# Patient Record
Sex: Female | Born: 1985 | Race: Black or African American | Hispanic: No | Marital: Married | Smoking: Never smoker
Health system: Southern US, Community
[De-identification: ages and names within clinical notes are randomized; demographics above are authoritative.]

## PROBLEM LIST (undated history)

## (undated) ENCOUNTER — Inpatient Hospital Stay (HOSPITAL_COMMUNITY): Payer: Self-pay

## (undated) DIAGNOSIS — T8859XA Other complications of anesthesia, initial encounter: Secondary | ICD-10-CM

## (undated) DIAGNOSIS — T4145XA Adverse effect of unspecified anesthetic, initial encounter: Secondary | ICD-10-CM

## (undated) DIAGNOSIS — A5901 Trichomonal vulvovaginitis: Secondary | ICD-10-CM

## (undated) DIAGNOSIS — Z8619 Personal history of other infectious and parasitic diseases: Secondary | ICD-10-CM

## (undated) DIAGNOSIS — D509 Iron deficiency anemia, unspecified: Secondary | ICD-10-CM

## (undated) DIAGNOSIS — A609 Anogenital herpesviral infection, unspecified: Secondary | ICD-10-CM

## (undated) DIAGNOSIS — Z973 Presence of spectacles and contact lenses: Secondary | ICD-10-CM

## (undated) DIAGNOSIS — D573 Sickle-cell trait: Secondary | ICD-10-CM

## (undated) DIAGNOSIS — A6000 Herpesviral infection of urogenital system, unspecified: Secondary | ICD-10-CM

## (undated) DIAGNOSIS — R51 Headache: Secondary | ICD-10-CM

## (undated) DIAGNOSIS — R609 Edema, unspecified: Principal | ICD-10-CM

## (undated) DIAGNOSIS — N760 Acute vaginitis: Secondary | ICD-10-CM

## (undated) DIAGNOSIS — B9689 Other specified bacterial agents as the cause of diseases classified elsewhere: Secondary | ICD-10-CM

## (undated) HISTORY — PX: WISDOM TOOTH EXTRACTION: SHX21

---
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
## 2009-08-06 ENCOUNTER — Emergency Department (HOSPITAL_COMMUNITY): Admission: EM | Admit: 2009-08-06 | Discharge: 2009-08-06 | Payer: Self-pay | Admitting: Emergency Medicine

## 2010-01-24 ENCOUNTER — Inpatient Hospital Stay (HOSPITAL_COMMUNITY): Admission: AD | Admit: 2010-01-24 | Discharge: 2010-01-24 | Payer: Self-pay | Admitting: Obstetrics & Gynecology

## 2010-01-31 ENCOUNTER — Inpatient Hospital Stay (HOSPITAL_COMMUNITY): Admission: RE | Admit: 2010-01-31 | Discharge: 2010-02-03 | Payer: Self-pay | Admitting: Obstetrics and Gynecology

## 2010-09-26 ENCOUNTER — Emergency Department (HOSPITAL_COMMUNITY)
Admission: EM | Admit: 2010-09-26 | Discharge: 2010-09-26 | Payer: Self-pay | Source: Home / Self Care | Admitting: Emergency Medicine

## 2010-09-28 ENCOUNTER — Inpatient Hospital Stay (HOSPITAL_COMMUNITY)
Admission: AD | Admit: 2010-09-28 | Discharge: 2010-09-28 | Payer: Self-pay | Source: Home / Self Care | Attending: Obstetrics and Gynecology | Admitting: Obstetrics and Gynecology

## 2010-10-01 ENCOUNTER — Inpatient Hospital Stay (HOSPITAL_COMMUNITY)
Admission: AD | Admit: 2010-10-01 | Discharge: 2010-10-01 | Payer: Self-pay | Source: Home / Self Care | Attending: Obstetrics and Gynecology | Admitting: Obstetrics and Gynecology

## 2010-12-26 LAB — POCT URINALYSIS DIPSTICK
Bilirubin Urine: NEGATIVE
Glucose, UA: NEGATIVE mg/dL
Hgb urine dipstick: NEGATIVE
Ketones, ur: NEGATIVE mg/dL
Nitrite: NEGATIVE
Protein, ur: NEGATIVE mg/dL
Specific Gravity, Urine: >=1.03 (ref 1.005–1.030)
Urobilinogen, UA: 1 mg/dL (ref 0.0–1.0)
pH: 6 (ref 5.0–8.0)

## 2010-12-26 LAB — POCT PREGNANCY, URINE: Preg Test, Ur: NEGATIVE

## 2011-01-03 LAB — NO BLOOD PRODUCTS

## 2011-01-03 LAB — CBC
HCT: 33 % — ABNORMAL LOW (ref 36.0–46.0)
Hemoglobin: 11.2 g/dL — ABNORMAL LOW (ref 12.0–15.0)
MCHC: 33.8 g/dL (ref 30.0–36.0)
MCV: 86.6 fL (ref 78.0–100.0)
Platelets: 122 10*3/uL — ABNORMAL LOW (ref 150–400)
Platelets: 140 10*3/uL — ABNORMAL LOW (ref 150–400)
RBC: 3.82 MIL/uL — ABNORMAL LOW (ref 3.87–5.11)
RDW: 14.4 % (ref 11.5–15.5)
WBC: 13.8 10*3/uL — ABNORMAL HIGH (ref 4.0–10.5)
WBC: 9.5 10*3/uL (ref 4.0–10.5)

## 2011-01-03 LAB — RPR: RPR Ser Ql: NONREACTIVE

## 2011-01-19 LAB — BASIC METABOLIC PANEL
BUN: 6 mg/dL (ref 6–23)
Calcium: 9.1 mg/dL (ref 8.4–10.5)
GFR calc non Af Amer: >60 mL/min (ref >60)
Potassium: 3.5 mEq/L (ref 3.5–5.1)
Sodium: 135 mEq/L (ref 135–145)

## 2011-01-19 LAB — URINALYSIS, ROUTINE W REFLEX MICROSCOPIC
Nitrite: NEGATIVE
Specific Gravity, Urine: 1.035 — ABNORMAL HIGH (ref 1.005–1.030)
pH: 6.5 (ref 5.0–8.0)

## 2011-01-19 LAB — DIFFERENTIAL
Eosinophils Relative: 0 % (ref 0–5)
Lymphocytes Relative: 20 % (ref 12–46)
Lymphs Abs: 1.5 10*3/uL (ref 0.7–4.0)
Neutro Abs: 5.4 10*3/uL (ref 1.7–7.7)

## 2011-01-19 LAB — CBC
HCT: 34.4 % — ABNORMAL LOW (ref 36.0–46.0)
Platelets: 169 10*3/uL (ref 150–400)
WBC: 7.5 10*3/uL (ref 4.0–10.5)

## 2011-01-19 LAB — HCG, QUANTITATIVE, PREGNANCY: hCG, Beta Chain, Quant, S: 42001 m[IU]/mL — ABNORMAL HIGH (ref <5)

## 2011-04-14 ENCOUNTER — Inpatient Hospital Stay (INDEPENDENT_AMBULATORY_CARE_PROVIDER_SITE_OTHER)
Admission: RE | Admit: 2011-04-14 | Discharge: 2011-04-14 | Disposition: A | Discharge status: Home / Self Care | Payer: Self-pay | Source: Ambulatory Visit | Attending: Family Medicine | Admitting: Family Medicine

## 2011-04-14 DIAGNOSIS — K921 Melena: Secondary | ICD-10-CM

## 2011-04-14 LAB — OCCULT BLOOD, POC DEVICE: Fecal Occult Bld: POSITIVE

## 2011-12-12 ENCOUNTER — Emergency Department (INDEPENDENT_AMBULATORY_CARE_PROVIDER_SITE_OTHER)
Admission: EM | Admit: 2011-12-12 | Discharge: 2011-12-12 | Disposition: A | Discharge status: Home / Self Care | Payer: Self-pay | Source: Home / Self Care | Attending: Emergency Medicine | Admitting: Emergency Medicine

## 2011-12-12 ENCOUNTER — Encounter (HOSPITAL_COMMUNITY): Payer: Self-pay | Admitting: Emergency Medicine

## 2011-12-12 DIAGNOSIS — G43909 Migraine, unspecified, not intractable, without status migrainosus: Secondary | ICD-10-CM

## 2011-12-12 DIAGNOSIS — R319 Hematuria, unspecified: Secondary | ICD-10-CM

## 2011-12-12 DIAGNOSIS — K529 Noninfective gastroenteritis and colitis, unspecified: Secondary | ICD-10-CM

## 2011-12-12 DIAGNOSIS — G43109 Migraine with aura, not intractable, without status migrainosus: Secondary | ICD-10-CM

## 2011-12-12 DIAGNOSIS — K5289 Other specified noninfective gastroenteritis and colitis: Secondary | ICD-10-CM

## 2011-12-12 DIAGNOSIS — H9209 Otalgia, unspecified ear: Secondary | ICD-10-CM

## 2011-12-12 DIAGNOSIS — M549 Dorsalgia, unspecified: Secondary | ICD-10-CM

## 2011-12-12 LAB — POCT PREGNANCY, URINE: Preg Test, Ur: NEGATIVE

## 2011-12-12 LAB — POCT URINALYSIS DIP (DEVICE)
Bilirubin Urine: NEGATIVE
Glucose, UA: NEGATIVE mg/dL
Ketones, ur: NEGATIVE mg/dL
Nitrite: NEGATIVE
pH: 6 (ref 5.0–8.0)

## 2011-12-12 MED ORDER — TRAMADOL HCL 50 MG PO TABS: 100.0000 mg | ORAL_TABLET | Freq: Three times a day (TID) | ORAL | Status: AC | PRN

## 2011-12-12 MED ORDER — METHOCARBAMOL 500 MG PO TABS: 500.0000 mg | ORAL_TABLET | Freq: Three times a day (TID) | ORAL | Status: AC

## 2011-12-12 MED ORDER — MELOXICAM 7.5 MG PO TABS: 7.5000 mg | ORAL_TABLET | Freq: Every day | ORAL | Status: DC

## 2011-12-12 MED ORDER — ONDANSETRON 8 MG PO TBDP: 8.0000 mg | ORAL_TABLET | Freq: Three times a day (TID) | ORAL | Status: AC | PRN

## 2011-12-12 MED ORDER — CEPHALEXIN 500 MG PO CAPS: 500.0000 mg | ORAL_CAPSULE | Freq: Three times a day (TID) | ORAL | Status: AC

## 2011-12-12 NOTE — ED Provider Notes (Signed)
History     CSN: [id number]  Arrival date & time 12/12/11  0865   First MD Initiated Contact with Patient 12/12/11 1003      Chief Complaint  Patient presents with  . Otalgia    (Consider location/radiation/quality/duration/timing/severity/associated sxs/prior treatment) HPI Comments: The patient is in today for multiple problems including headache, back pain, abdominal pain, and earache. Most of these have been going on for a couple of days.  The back pain is her biggest problem. It's localized to the mid back area and may be on the left side, right side, or both sides. Is rated as a 7/10 in intensity and feels like an ache. There is no radiation. She denies any injury, states she just woke up with the pain. There is nothing that makes the pain worse. It's better with heat. She didn't have it before. She denies any urinary symptoms, numbness, tingling, or weakness in the lower extremities.  Also since yesterday she's had some headache. This is bifrontal, sharp, and associated with nasal congestion. She notes some nausea and some photophobia. His better with pain meds. She denies any visual or neurological symptoms. She does have a history of migraine type headaches in the past.  Also since yesterday she's had an epigastric pain. She states she feels like she's been kicked in the abdomen. The pain is got a little bit better and she's felt nauseated and had 4 diarrheal stools yesterday which were yellow in color. She denies any nausea or vomiting.  Also since yesterday she's had pain in both of her ears. She denies any drainage. She's felt chilled and hot.  Patient is a 26 y.o. female presenting with ear pain.  Otalgia Associated symptoms include headaches, abdominal pain and diarrhea. Pertinent negatives include no rhinorrhea, no sore throat, no vomiting, no cough and no rash.    History reviewed. No pertinent past medical history.  Past Surgical History  Procedure Date  . Cesarean  section     Family History  Problem Relation Age of Onset  . Hypertension Other     History  Substance Use Topics  . Smoking status: Current Everyday Smoker  . Smokeless tobacco: Not on file  . Alcohol Use: Yes    OB History    Grav Para Term Preterm Abortions TAB SAB Ect Mult Living                  Review of Systems  Constitutional: Negative for fever, chills, fatigue and unexpected weight change.  HENT: Positive for ear pain. Negative for congestion, sore throat and rhinorrhea.   Eyes: Negative for redness and visual disturbance.  Respiratory: Negative for cough, shortness of breath and wheezing.   Cardiovascular: Negative for chest pain and palpitations.  Gastrointestinal: Positive for abdominal pain and diarrhea. Negative for nausea, vomiting and constipation.  Genitourinary: Negative for dysuria, urgency and frequency.  Musculoskeletal: Positive for back pain.  Skin: Negative for rash.  Neurological: Positive for headaches. Negative for dizziness, tremors, seizures, syncope, facial asymmetry, speech difficulty, weakness, light-headedness and numbness.    Allergies  Review of patient's allergies indicates no known allergies.  Home Medications   Current Outpatient Rx  Name Route Sig Dispense Refill  . ACETAMINOPHEN 325 MG PO TABS Oral Take 650 mg by mouth every 6 (six) hours as needed.    . CEPHALEXIN 500 MG PO CAPS Oral Take 1 capsule (500 mg total) by mouth 3 (three) times daily. 30 capsule 0  . MELOXICAM 7.5 MG PO  TABS Oral Take 1 tablet (7.5 mg total) by mouth daily. 15 tablet 0  . METHOCARBAMOL 500 MG PO TABS Oral Take 1 tablet (500 mg total) by mouth 3 (three) times daily. 30 tablet 0  . ONDANSETRON 8 MG PO TBDP Oral Take 1 tablet (8 mg total) by mouth every 8 (eight) hours as needed for nausea. 20 tablet 0  . TRAMADOL HCL 50 MG PO TABS Oral Take 2 tablets (100 mg total) by mouth every 8 (eight) hours as needed for pain. 30 tablet 0    BP 129/89  Pulse 83   Temp(Src) 98.6 F (37 C) (Oral)  SpO2 99%  LMP 12/12/2011  Physical Exam  Nursing note and vitals reviewed. Constitutional: She is oriented to person, place, and time. She appears well-developed and well-nourished. No distress.  HENT:  Head: Normocephalic and atraumatic.  Mouth/Throat: Oropharynx is clear and moist.  Eyes: Conjunctivae and EOM are normal. Pupils are equal, round, and reactive to light. No scleral icterus.  Neck: Normal range of motion. Neck supple.  Cardiovascular: Normal rate, regular rhythm and normal heart sounds.  Exam reveals no gallop and no friction rub.   No murmur heard. Pulmonary/Chest: Effort normal and breath sounds normal. No respiratory distress. She has no wheezes. She has no rales.  Abdominal: Soft. Bowel sounds are normal. She exhibits no distension and no mass. There is no tenderness. There is no rebound and no guarding.  Lymphadenopathy:    She has no cervical adenopathy.  Neurological: She is alert and oriented to person, place, and time.  Skin: Skin is warm and dry. No rash noted. She is not diaphoretic.    ED Course  Procedures (including critical care time)  Labs Reviewed  POCT URINALYSIS DIP (DEVICE) - Abnormal; Notable for the following:    Hgb urine dipstick LARGE (*)    Protein, ur 30 (*)    Leukocytes, UA SMALL (*) Biochemical Testing Only. Please order routine urinalysis from main lab if confirmatory testing is needed.   All other components within normal limits  POCT PREGNANCY, URINE  URINE CULTURE   No results found.   1. Back pain   2. Migraine headache   3. Gastroenteritis   4. Earache   5. Hematuria       MDM          Roque Lias, MD 12/12/11 [phone]

## 2011-12-12 NOTE — Discharge Instructions (Signed)
B.R.A.T. Diet Your doctor has recommended the B.R.A.T. diet for you or your child until the condition improves. This is often used to help control diarrhea and vomiting symptoms. If you or your child can tolerate clear liquids, you may have:  Bananas.   Rice.   Applesauce.   Toast (and other simple starches such as crackers, potatoes, noodles).  Be sure to avoid dairy products, meats, and fatty foods until symptoms are better. Fruit juices such as apple, grape, and prune juice can make diarrhea worse. Avoid these. Continue this diet for 2 days or as instructed by your caregiver. Document Released: 10/02/2005 Document Revised: 06/14/2011 Document Reviewed: 03/21/2007 [facility] Patient Information 2012 [place], [place].Back Exercises Back exercises help treat and prevent back injuries. The goal is to increase your strength in your belly (abdominal) and back muscles. These exercises can also help with flexibility. Start these exercises when told by your doctor. HOME CARE Back exercises include: Pelvic Tilt.  Lie on your back with your knees bent. Tilt your pelvis until the lower part of your back is against the floor. Hold this position 5 to 10 sec. Repeat this exercise 5 to 10 times.  Knee to Chest.  Pull 1 knee up against your chest and hold for 20 to 30 seconds. Repeat this with the other knee. This may be done with the other leg straight or bent, whichever feels better. Then, pull both knees up against your chest.  Sit-Ups or Curl-Ups.  Bend your knees 90 degrees. Start with tilting your pelvis, and do a partial, slow sit-up. Only lift your upper half 30 to 45 degrees off the floor. Take at least 2 to 3 seonds for each sit-up. Do not do sit-ups with your knees out straight. If partial sit-ups are difficult, simply do the above but with only tightening your belly (abdominal) muscles and holding it as told.  Hip-Lift.  Lie on your back with your knees flexed 90 degrees. Push down with your  feet and shoulders as you raise your hips 2 inches off the floor. Hold for 10 seconds, repeat 5 to 10 times.  Back Arches.  Lie on your stomach. Prop yourself up on bent elbows. Slowly press on your hands, causing an arch in your low back. Repeat 3 to 5 times.  Shoulder-Lifts.  Lie face down with arms beside your body. Keep hips and belly pressed to floor as you slowly lift your head and shoulders off the floor.  Do not overdo your exercises. Be careful in the beginning. Exercises may cause you some mild back discomfort. If the pain lasts for more than 15 minutes, stop the exercises until you see your doctor. Improvement with exercise for back problems is slow.  Document Released: 11/04/2010 Document Revised: 06/14/2011 Document Reviewed: 11/04/2010 [facility] Patient Information 2012 [place], [place].

## 2011-12-12 NOTE — ED Notes (Signed)
C/o head, back, stomach and ears aching.  Onset yesterday of symptoms.  Also had" severe" diarrhea yesterday per patient, reports for episodes. Also reports no episodes of diarrhea today

## 2011-12-13 LAB — URINE CULTURE: Culture  Setup Time: 201302261734

## 2011-12-27 ENCOUNTER — Emergency Department (INDEPENDENT_AMBULATORY_CARE_PROVIDER_SITE_OTHER)
Admission: EM | Admit: 2011-12-27 | Discharge: 2011-12-27 | Disposition: A | Discharge status: Home / Self Care | Payer: Self-pay | Source: Home / Self Care | Attending: Emergency Medicine | Admitting: Emergency Medicine

## 2011-12-27 ENCOUNTER — Encounter (HOSPITAL_COMMUNITY): Payer: Self-pay | Admitting: *Deleted

## 2011-12-27 DIAGNOSIS — K0500 Acute gingivitis, plaque induced: Secondary | ICD-10-CM

## 2011-12-27 DIAGNOSIS — Z8719 Personal history of other diseases of the digestive system: Secondary | ICD-10-CM

## 2011-12-27 MED ORDER — PENICILLIN V POTASSIUM 500 MG PO TABS: 500.0000 mg | ORAL_TABLET | Freq: Three times a day (TID) | ORAL | Status: AC

## 2011-12-27 MED ORDER — ACETAMINOPHEN-CODEINE #3 300-30 MG PO TABS: 1.0000 | ORAL_TABLET | Freq: Four times a day (QID) | ORAL | Status: AC | PRN

## 2011-12-27 NOTE — ED Provider Notes (Signed)
History     CSN: [id number]  Arrival date & time 12/27/11  1038   First MD Initiated Contact with Patient 12/27/11 1144      Chief Complaint  Patient presents with  . Jaw Pain    (Consider location/radiation/quality/duration/timing/severity/associated sxs/prior treatment) HPI Comments: Patient presents urgent care today complaining of the left lower jaw pain in swelling been sore inside of her mouth for about 2 days has gotten progressively worse the tried use an over-the-counter cream applying it directly to the area sore on her lower left molars with partial relief. This morning and noticed her left  JAW was mildly swollen and having a throbbing pain  The history is provided by the patient.    History reviewed. No pertinent past medical history.  Past Surgical History  Procedure Date  . Cesarean section     Family History  Problem Relation Age of Onset  . Hypertension Other     History  Substance Use Topics  . Smoking status: Current Everyday Smoker  . Smokeless tobacco: Not on file  . Alcohol Use: Yes    OB History    Grav Para Term Preterm Abortions TAB SAB Ect Mult Living                  Review of Systems  Constitutional: Negative for fever, activity change, appetite change and fatigue.  HENT: Negative for neck stiffness.   Skin: Negative for rash.    Allergies  Review of patient's allergies indicates no known allergies.  Home Medications   Current Outpatient Rx  Name Route Sig Dispense Refill  . ACETAMINOPHEN 325 MG PO TABS Oral Take 650 mg by mouth every 6 (six) hours as needed.    . ACETAMINOPHEN-CODEINE #3 300-30 MG PO TABS Oral Take 1-2 tablets by mouth every 6 (six) hours as needed for pain. 15 tablet 0  . MELOXICAM 7.5 MG PO TABS Oral Take 1 tablet (7.5 mg total) by mouth daily. 15 tablet 0  . PENICILLIN V POTASSIUM 500 MG PO TABS Oral Take 1 tablet (500 mg total) by mouth 3 (three) times daily. 30 tablet 0    BP 115/78  Pulse 90   Temp(Src) 98.4 F (36.9 C) (Oral)  Resp 20  SpO2 99%  LMP 12/12/2011  Physical Exam  Nursing note and vitals reviewed. Constitutional: She appears well-developed and well-nourished. No distress.  HENT:  Mouth/Throat: Uvula is midline. Oral lesions present. No dental abscesses, uvula swelling or dental caries. No oropharyngeal exudate.      ED Course  Procedures (including critical care time)  Labs Reviewed - No data to display No results found.   1. Gingivitis, acute   2. History of dental abscess       MDM  Patient presents urgent care complaining of left lower jaw pain in oral swelling for less than 24 hours. Afebrile with marked gingival erythema surrounding last lower left molar and minimally perceivable soft tissue swelling on the mandibular region. Afebrile.        Jimmie Molly, MD 12/27/11 [phone]

## 2011-12-27 NOTE — ED Notes (Signed)
Pt is here with complaints of lower left jaw pain and oral swelling with onset yesterday.

## 2011-12-27 NOTE — Discharge Instructions (Signed)
Dental Care and Dentist Visits   Dental care supports good overall health. Regular dental visits can also help you avoid dental pain, bleeding, infection, and other more serious health problems in the future. It is important to keep the mouth healthy because diseases in the teeth, gums, and other oral tissues can spread to other areas of the body. Some problems, such as diabetes, heart disease, and pre-term labor have been associated with poor oral health.   See your dentist every 6 months. If you experience emergency problems such as a toothache or broken tooth, go to the dentist right away. If you see your dentist regularly, you may catch problems early. It is easier to be treated for problems in the early stages.   WHAT TO EXPECT AT A DENTIST VISIT   Your dentist will look for many common oral health problems and recommend proper treatment. At your regular dental visit, you can expect:   Gentle cleaning of the teeth and gums. This includes scraping and polishing. This helps to remove the sticky substance around the teeth and gums (plaque). Plaque forms in the mouth shortly after eating. Over time, plaque hardens on the teeth as tartar. If tartar is not removed regularly, it can cause problems. Cleaning also helps remove stains.   Periodic X-rays. These pictures of the teeth and supporting bone will help your dentist assess the health of your teeth.   Periodic fluoride treatments. Fluoride is a natural mineral shown to help strengthen teeth. Fluoride treatment involves applying a fluoride gel or varnish to the teeth. It is most commonly done in children.   Examination of the mouth, tongue, jaws, teeth, and gums to look for any oral health problems, such as:   Cavities (dental caries). This is decay on the tooth caused by plaque, sugar, and acid in the mouth. It is best to catch a cavity when it is small.   Inflammation of the gums caused by plaque buildup (gingivitis).   Problems with the mouth or malformed or  misaligned teeth.   Oral cancer or other diseases of the soft tissues or jaws.   KEEP YOUR TEETH AND GUMS HEALTHY   For healthy teeth and gums, follow these general guidelines as well as your dentist's specific advice:   Have your teeth professionally cleaned at the dentist every 6 months.   Brush twice daily with a fluoride toothpaste.   Floss your teeth daily.   Ask your dentist if you need fluoride supplements, treatments, or fluoride toothpaste.   Eat a healthy diet. Reduce foods and drinks with added sugar.   Avoid smoking.  TREATMENT FOR ORAL HEALTH PROBLEMS   If you have oral health problems, treatment varies depending on the conditions present in your teeth and gums.   Your caregiver will most likely recommend good oral hygiene at each visit.   For cavities, gingivitis, or other oral health disease, your caregiver will perform a procedure to treat the problem. This is typically done at a separate appointment. Sometimes your caregiver will refer you to another dental specialist for specific tooth problems or for surgery.  SEEK IMMEDIATE DENTAL CARE IF:   You have pain, bleeding, or soreness in the gum, tooth, jaw, or mouth area.   A permanent tooth becomes loose or separated from the gum socket.   You experience a blow or injury to the mouth or jaw area.  Document Released: 06/14/2011 Document Revised: 09/21/2011 Document Reviewed: 06/14/2011   [employer] Patient Information 2012 [employer].

## 2012-01-31 ENCOUNTER — Emergency Department (INDEPENDENT_AMBULATORY_CARE_PROVIDER_SITE_OTHER)
Admission: EM | Admit: 2012-01-31 | Discharge: 2012-01-31 | Disposition: A | Discharge status: Home / Self Care | Payer: Self-pay | Source: Home / Self Care | Attending: Emergency Medicine | Admitting: Emergency Medicine

## 2012-01-31 ENCOUNTER — Encounter (HOSPITAL_COMMUNITY): Payer: Self-pay

## 2012-01-31 DIAGNOSIS — R609 Edema, unspecified: Secondary | ICD-10-CM

## 2012-01-31 HISTORY — DX: Edema, unspecified: R60.9

## 2012-01-31 LAB — POCT I-STAT, CHEM 8
BUN: 7 mg/dL (ref 6–23)
Calcium, Ion: 1.22 mmol/L (ref 1.12–1.32)
Chloride: 106 mEq/L (ref 96–112)
Creatinine, Ser: 0.9 mg/dL (ref 0.50–1.10)
TCO2: 25 mmol/L (ref 0–100)

## 2012-01-31 LAB — POCT URINALYSIS DIP (DEVICE)
Hgb urine dipstick: NEGATIVE
Protein, ur: NEGATIVE mg/dL
Specific Gravity, Urine: 1.025 (ref 1.005–1.030)
Urobilinogen, UA: 0.2 mg/dL (ref 0.0–1.0)
pH: 6.5 (ref 5.0–8.0)

## 2012-01-31 NOTE — ED Notes (Signed)
patient has pain and swelling in right foot; no known injury to account for pain and swelling; pitting edema to ankle, foot w/d/color good, difficult to palpate pedal pulse, but is present

## 2012-01-31 NOTE — ED Provider Notes (Signed)
History     CSN: [id number]  Arrival date & time 01/31/12  1228   First MD Initiated Contact with Patient 01/31/12 1415      Chief Complaint  Patient presents with  . Foot Pain    (Consider location/radiation/quality/duration/timing/severity/associated sxs/prior treatment) HPI Comments: Patient with intermittent right foot swelling for 2 weeks. There no aggravating or alleviating factors. States the foot swelling is not worse at the end of the day, and is not relieved with elevation. Took an unknown "water pill" x one without any improvement in her symptoms. Denies any remote or recent injury to her foot, increase in physical activity, new shoes. No color change in her foot, paresthesias, lower extremity edema, swelling of her other foot, unintentional weight gain, facial swelling. No urinary complaints. No history of DVT or PE. No history of diabetes, renal disease. Patient states that her feet, right more than left, would swell but this happened near the end of her pregnancy.   ROS as noted in HPI. All other ROS negative.   Patient is a 26 y.o. female presenting with lower extremity pain. The history is provided by the patient.  Foot Pain    Past Medical History  Diagnosis Date  . Peripheral edema     Past Surgical History  Procedure Date  . Cesarean section     Family History  Problem Relation Age of Onset  . Hypertension Other     History  Substance Use Topics  . Smoking status: Current Everyday Smoker  . Smokeless tobacco: Not on file  . Alcohol Use: Yes    OB History    Grav Para Term Preterm Abortions TAB SAB Ect Mult Living                  Review of Systems  Allergies  Review of patient's allergies indicates no known allergies.  Home Medications   Current Outpatient Rx  Name Route Sig Dispense Refill  . ACETAMINOPHEN 325 MG PO TABS Oral Take 650 mg by mouth every 6 (six) hours as needed.    . MELOXICAM 7.5 MG PO TABS Oral Take 1 tablet (7.5 mg  total) by mouth daily. 15 tablet 0    BP 112/65  Pulse 70  Temp(Src) 97 F (36.1 C) (Oral)  Resp 16  SpO2 100%  LMP 12/16/2011  Physical Exam  Nursing note and vitals reviewed. Constitutional: She is oriented to person, place, and time. She appears well-developed and well-nourished. No distress.  HENT:  Head: Normocephalic and atraumatic.  Eyes: Conjunctivae and EOM are normal.  Neck: Normal range of motion.  Cardiovascular: Normal rate, regular rhythm, normal heart sounds and intact distal pulses.   Pulmonary/Chest: Effort normal.  Abdominal: She exhibits no distension.  Musculoskeletal: Normal range of motion.       1+ pitting edema right foot to ankle. No ankle, foot bony tenderness. Skin warm and intact. Refill less than 2 seconds. No signs of trauma, deformity. Sensation grossly intact. No edema left foot, or on bilateral calves. Calves nontender, symmetric.  Neurological: She is alert and oriented to person, place, and time.  Skin: Skin is warm and dry.  Psychiatric: She has a normal mood and affect. Her behavior is normal. Judgment and thought content normal.    ED Course  Procedures (including critical care time)  Labs Reviewed  POCT URINALYSIS DIP (DEVICE) - Abnormal; Notable for the following:    Ketones, ur TRACE (*)    All other components within normal limits  POCT PREGNANCY, URINE  POCT I-STAT, CHEM 8   No results found.   1. Peripheral edema     Results for orders placed during the hospital encounter of 01/31/12  POCT URINALYSIS DIP (DEVICE)      Component Value Range   Glucose, UA NEGATIVE  NEGATIVE (mg/dL)   Bilirubin Urine NEGATIVE  NEGATIVE    Ketones, ur TRACE (*) NEGATIVE (mg/dL)   Specific Gravity, Urine 1.025  1.005 - 1.030    Hgb urine dipstick NEGATIVE  NEGATIVE    pH 6.5  5.0 - 8.0    Protein, ur NEGATIVE  NEGATIVE (mg/dL)   Urobilinogen, UA 0.2  0.0 - 1.0 (mg/dL)   Nitrite NEGATIVE  NEGATIVE    Leukocytes, UA NEGATIVE  NEGATIVE     POCT PREGNANCY, URINE      Component Value Range   Preg Test, Ur NEGATIVE  NEGATIVE   POCT I-STAT, CHEM 8      Component Value Range   Sodium 139  135 - 145 (mEq/L)   Potassium 3.9  3.5 - 5.1 (mEq/L)   Chloride 106  96 - 112 (mEq/L)   BUN 7  6 - 23 (mg/dL)   Creatinine, Ser 1.47  0.50 - 1.10 (mg/dL)   Glucose, Bld 84  70 - 99 (mg/dL)   Calcium, Ion 8.29  5.62 - 1.32 (mmol/L)   TCO2 25  0 - 100 (mmol/L)   Hemoglobin 13.3  12.0 - 15.0 (g/dL)   HCT 13.0  86.5 - 78.4 (%)    MDM  Previous records reviewed. Additional medical history obtained.  Patient has no tenderness along her foot or ankle. No history of trauma. Doubt occult fracture.  past symmetric. Doubt DVT.Patient states that her feet as well as she's pregnant, and is late for her menses. Checking defect. Also checking UA to rule out proteinuria. Patient denies any facial swelling, unintentional weight gain. Also checking i-STAT to evaluate kidney function. No evidence of infection.  Labs reviewed. No hematuria, proteinuria. Normal. Discussed results with patient. Will have her try TED hose in addition to decreasing salt in her diet. Emphasized importance of primary care followup. Will refer her to local resources. Patient agrees with plan.  Luiz Blare, MD 01/31/12 [phone]

## 2012-01-31 NOTE — Discharge Instructions (Signed)
Your urine and bloodwork were normal today. Try to reduce the salt in your diet, cut out process foods such as telling me, pre-packaged dinners. Keep your foot elevated as she possibly can. Try some shoes with good support to help prevent swelling. Wear the compression hose at all times until the swelling starts to improve. You'll need to find primary care physician for routine physical care. Below is a list of resources.  Go to www.goodrx.com to look up your medications. This will give you a list of where you can find your prescriptions at the most affordable prices.   RESOURCE GUIDE  Chronic Pain Problems Contact [facility]  [phone] Patients need to be referred by their primary care doctor.  Insufficient Money for Medicine Contact [employer]:  call "211" or [facility] [phone].  No Primary Care Doctor Call [facility]  [phone] Other agencies that provide inexpensive medical care    [facility]  [phone]    [facility]  [phone]    [facility]  [phone]    [facility]  [phone] [address] [place] [address]    [facility]  [phone]    [facility]  [phone] [facility] [phone]   [address] [place] [address] [place], [place] [address]  [facility] Resources  [facility]     [employer]                          [facility] [address] [place]                       [address]      [address] [place] [address]   [phone] (After Hours)

## 2012-12-11 ENCOUNTER — Emergency Department (HOSPITAL_COMMUNITY)
Admission: EM | Admit: 2012-12-11 | Discharge: 2012-12-11 | Disposition: A | Discharge status: Home / Self Care | Payer: Self-pay | Attending: Emergency Medicine | Admitting: Emergency Medicine

## 2012-12-11 ENCOUNTER — Encounter (HOSPITAL_COMMUNITY): Payer: Self-pay | Admitting: *Deleted

## 2012-12-11 DIAGNOSIS — F17200 Nicotine dependence, unspecified, uncomplicated: Secondary | ICD-10-CM | POA: Insufficient documentation

## 2012-12-11 DIAGNOSIS — Z23 Encounter for immunization: Secondary | ICD-10-CM | POA: Insufficient documentation

## 2012-12-11 DIAGNOSIS — Z8742 Personal history of other diseases of the female genital tract: Secondary | ICD-10-CM | POA: Insufficient documentation

## 2012-12-11 DIAGNOSIS — L02619 Cutaneous abscess of unspecified foot: Secondary | ICD-10-CM | POA: Insufficient documentation

## 2012-12-11 LAB — COMPREHENSIVE METABOLIC PANEL
AST: 20 U/L (ref 0–37)
Albumin: 3.7 g/dL (ref 3.5–5.2)
Chloride: 102 mEq/L (ref 96–112)
Creatinine, Ser: 0.77 mg/dL (ref 0.50–1.10)
Potassium: 3.7 mEq/L (ref 3.5–5.1)
Total Bilirubin: 0.4 mg/dL (ref 0.3–1.2)
Total Protein: 7.7 g/dL (ref 6.0–8.3)

## 2012-12-11 LAB — CBC WITH DIFFERENTIAL/PLATELET
Basophils Absolute: 0 10*3/uL (ref 0.0–0.1)
Basophils Relative: 0 % (ref 0–1)
MCHC: 35.8 g/dL (ref 30.0–36.0)
Monocytes Absolute: 0.4 10*3/uL (ref 0.1–1.0)
Neutro Abs: 2.9 10*3/uL (ref 1.7–7.7)
Neutrophils Relative %: 45 % (ref 43–77)
RDW: 13.6 % (ref 11.5–15.5)

## 2012-12-11 MED ORDER — CLINDAMYCIN HCL 150 MG PO CAPS: 150.0000 mg | ORAL_CAPSULE | Freq: Four times a day (QID) | ORAL | Status: DC

## 2012-12-11 MED ORDER — TETANUS-DIPHTH-ACELL PERTUSSIS 5-2.5-18.5 LF-MCG/0.5 IM SUSP
0.5000 mL | Freq: Once | INTRAMUSCULAR | Status: AC
  Administered 2012-12-11: 0.5 mL via INTRAMUSCULAR
  Filled 2012-12-11: qty 0.5

## 2012-12-11 NOTE — ED Notes (Signed)
Redness and swelling to dorsal aspect of rgt foot; warm to touch; no known injury; good PMS to rgt foot

## 2012-12-11 NOTE — ED Notes (Signed)
Pt noticed swollen 5th digit to her R foot  2 days ago.  Today she noticed a crack between the 4th and 5th digit, redness to top of foot and a warm sensation going up her leg.  C/o itching/burning/ stinging pain to top of foot.

## 2012-12-11 NOTE — ED Provider Notes (Signed)
History     CSN: [id number]  Arrival date & time 12/11/12  1814   First MD Initiated Contact with Patient 12/11/12 2306      Chief Complaint  Patient presents with  . Cellulitis    (Consider location/radiation/quality/duration/timing/severity/associated sxs/prior treatment) The history is provided by the patient.  pt here with right foot swelling and redness to the top of the foot x 24 hrs--some itching, no fever--sx have been constant--worse with walking, no meds taken pta, no drainage--denies trauma, no prior h/o same  Past Medical History  Diagnosis Date  . Peripheral edema     Past Surgical History  Procedure Laterality Date  . Cesarean section      Family History  Problem Relation Age of Onset  . Hypertension Other     History  Substance Use Topics  . Smoking status: Current Some Day Smoker  . Smokeless tobacco: Not on file  . Alcohol Use: 4.2 oz/week    7 Glasses of wine per week    OB History   Grav Para Term Preterm Abortions TAB SAB Ect Mult Living                  Review of Systems  All other systems reviewed and are negative.    Allergies  Review of patient's allergies indicates no known allergies.  Home Medications  No current outpatient prescriptions on file.  BP 117/79  Pulse 78  Temp(Src) 97.6 F (36.4 C) (Oral)  Resp 16  SpO2 99%  LMP 12/01/2012  Physical Exam  Nursing note and vitals reviewed. Constitutional: She is oriented to person, place, and time. She appears well-developed and well-nourished.  Non-toxic appearance. No distress.  HENT:  Head: Normocephalic and atraumatic.  Eyes: Conjunctivae, EOM and lids are normal. Pupils are equal, round, and reactive to light.  Neck: Normal range of motion. Neck supple. No tracheal deviation present. No mass present.  Cardiovascular: Normal rate, regular rhythm and normal heart sounds.  Exam reveals no gallop.   No murmur heard. Pulmonary/Chest: Effort normal and breath sounds  normal. No stridor. No respiratory distress. She has no decreased breath sounds. She has no wheezes. She has no rhonchi. She has no rales.  Abdominal: Soft. Normal appearance and bowel sounds are normal. She exhibits no distension. There is no tenderness. There is no rebound and no CVA tenderness.  Musculoskeletal: Normal range of motion. She exhibits no edema and no tenderness.       Right foot: She exhibits tenderness. She exhibits no crepitus.       Feet:  Neurological: She is alert and oriented to person, place, and time. She has normal strength. No cranial nerve deficit or sensory deficit. GCS eye subscore is 4. GCS verbal subscore is 5. GCS motor subscore is 6.  Skin: Skin is warm and dry. No abrasion and no rash noted.  Psychiatric: She has a normal mood and affect. Her speech is normal and behavior is normal.    ED Course  Procedures (including critical care time)  Labs Reviewed  COMPREHENSIVE METABOLIC PANEL - Abnormal; Notable for the following:    Glucose, Bld 110 (*)    All other components within normal limits  CBC WITH DIFFERENTIAL   No results found.   No diagnosis found.    MDM   Tetanus updated  pt to be treated for cellulitis        Toy Baker, MD 12/11/12 [phone]

## 2013-03-04 ENCOUNTER — Emergency Department (HOSPITAL_COMMUNITY)
Admission: EM | Admit: 2013-03-04 | Discharge: 2013-03-04 | Disposition: A | Discharge status: Home / Self Care | Payer: Self-pay | Attending: Emergency Medicine | Admitting: Emergency Medicine

## 2013-03-04 ENCOUNTER — Encounter (HOSPITAL_COMMUNITY): Payer: Self-pay | Admitting: Emergency Medicine

## 2013-03-04 DIAGNOSIS — Z3202 Encounter for pregnancy test, result negative: Secondary | ICD-10-CM | POA: Insufficient documentation

## 2013-03-04 DIAGNOSIS — R109 Unspecified abdominal pain: Secondary | ICD-10-CM | POA: Insufficient documentation

## 2013-03-04 DIAGNOSIS — J029 Acute pharyngitis, unspecified: Secondary | ICD-10-CM | POA: Insufficient documentation

## 2013-03-04 DIAGNOSIS — J020 Streptococcal pharyngitis: Secondary | ICD-10-CM

## 2013-03-04 DIAGNOSIS — F17200 Nicotine dependence, unspecified, uncomplicated: Secondary | ICD-10-CM | POA: Insufficient documentation

## 2013-03-04 DIAGNOSIS — R197 Diarrhea, unspecified: Secondary | ICD-10-CM | POA: Insufficient documentation

## 2013-03-04 LAB — URINE MICROSCOPIC-ADD ON

## 2013-03-04 LAB — CBC WITH DIFFERENTIAL/PLATELET
Basophils Relative: 1 % (ref 0–1)
Eosinophils Relative: 2 % (ref 0–5)
HCT: 32.6 % — ABNORMAL LOW (ref 36.0–46.0)
Hemoglobin: 11.6 g/dL — ABNORMAL LOW (ref 12.0–15.0)
Lymphs Abs: 2.8 10*3/uL (ref 0.7–4.0)
MCH: 27.8 pg (ref 26.0–34.0)
MCV: 78 fL (ref 78.0–100.0)
Monocytes Absolute: 0.7 10*3/uL (ref 0.1–1.0)
Monocytes Relative: 8 % (ref 3–12)
RBC: 4.18 MIL/uL (ref 3.87–5.11)
WBC: 8.2 10*3/uL (ref 4.0–10.5)

## 2013-03-04 LAB — URINALYSIS, ROUTINE W REFLEX MICROSCOPIC
Bilirubin Urine: NEGATIVE
Glucose, UA: NEGATIVE mg/dL
Hgb urine dipstick: NEGATIVE
Specific Gravity, Urine: 1.022 (ref 1.005–1.030)
pH: 6 (ref 5.0–8.0)

## 2013-03-04 LAB — BASIC METABOLIC PANEL
BUN: 5 mg/dL — ABNORMAL LOW (ref 6–23)
CO2: 25 mEq/L (ref 19–32)
Chloride: 103 mEq/L (ref 96–112)
GFR calc non Af Amer: >90 mL/min (ref >90)
Glucose, Bld: 86 mg/dL (ref 70–99)
Potassium: 3.8 mEq/L (ref 3.5–5.1)

## 2013-03-04 LAB — POCT PREGNANCY, URINE: Preg Test, Ur: NEGATIVE

## 2013-03-04 MED ORDER — PENICILLIN G BENZATHINE 1200000 UNIT/2ML IM SUSP
1.2000 10*6.[IU] | Freq: Once | INTRAMUSCULAR | Status: AC
  Administered 2013-03-04: 1.2 10*6.[IU] via INTRAMUSCULAR
  Filled 2013-03-04: qty 2

## 2013-03-04 MED ORDER — HYDROCODONE-ACETAMINOPHEN 7.5-500 MG/15ML PO SOLN: 15.0000 mL | ORAL | Status: DC | PRN

## 2013-03-04 NOTE — ED Notes (Signed)
Pt presents with flu like symptoms for the past week.  States she had fever and chills that began last week that have resolved since.  Pt also complaining of a sore throat, swelling noted to throat- difficulty swallowing.  No respiratory distress noted, respirations equal and unlabored.  Pt admits to N/V, denies diarrhea.  Pt also has had abdominal pain for the past week- admits to pain with urination that has since resolved.  Denies any vaginal discharge.

## 2013-03-04 NOTE — ED Notes (Signed)
Had all the flu s/s last week and this week she has sorethoat and bad diarrhea and abd pain

## 2013-03-04 NOTE — ED Provider Notes (Signed)
History     CSN: [id number]  Arrival date & time 03/04/13  1318   First MD Initiated Contact with Patient 03/04/13 1543      Chief Complaint  Patient presents with  . Sore Throat  . Abdominal Pain    (Consider location/radiation/quality/duration/timing/severity/associated sxs/prior treatment) Patient is a 27 y.o. female presenting with pharyngitis and abdominal pain. The history is provided by the patient.  Sore Throat Associated symptoms include abdominal pain.  Abdominal Pain Associated symptoms include abdominal pain.   patient here with sore throat abdominal discomfort with watery diarrhea x3 days. No fever or neck pain. Does have difficulty swallowing. Some abdominal crampy that precedes a watery diarrhea. A recent travel history. No recent antibiotic use. Denies vaginal bleeding or discharge. Denies any hematuria or dysuria. No treatment used prior to arrival and symptoms have been persistent  Past Medical History  Diagnosis Date  . Peripheral edema     Past Surgical History  Procedure Laterality Date  . Cesarean section      Family History  Problem Relation Age of Onset  . Hypertension Other     History  Substance Use Topics  . Smoking status: Current Some Day Smoker  . Smokeless tobacco: Not on file  . Alcohol Use: 4.2 oz/week    7 Glasses of wine per week    OB History   Grav Para Term Preterm Abortions TAB SAB Ect Mult Living                  Review of Systems  Gastrointestinal: Positive for abdominal pain.  All other systems reviewed and are negative.    Allergies  Review of patient's allergies indicates no known allergies.  Home Medications   Current Outpatient Rx  Name  Route  Sig  Dispense  Refill  . Phenyleph-Doxylamine-DM-APAP (ALKA-SELTZER PLS NIGHT CLD/FLU PO)   Oral   Take 2 capsules by mouth every 6 (six) hours.         . Zinc Oxide 10 % OINT   Apply externally   Apply 1 application topically daily as needed.            BP 130/62  Pulse 86  Temp(Src) 98.5 F (36.9 C) (Oral)  Resp 16  SpO2 99%  Physical Exam  Nursing note and vitals reviewed. Constitutional: She is oriented to person, place, and time. She appears well-developed and well-nourished.  Non-toxic appearance. No distress.  HENT:  Head: Normocephalic and atraumatic.  Mouth/Throat: Posterior oropharyngeal erythema present. No oropharyngeal exudate.  Eyes: Conjunctivae, EOM and lids are normal. Pupils are equal, round, and reactive to light.  Neck: Normal range of motion. Neck supple. No tracheal deviation present. No mass present.  Cardiovascular: Normal rate, regular rhythm and normal heart sounds.  Exam reveals no gallop.   No murmur heard. Pulmonary/Chest: Effort normal and breath sounds normal. No stridor. No respiratory distress. She has no decreased breath sounds. She has no wheezes. She has no rhonchi. She has no rales.  Abdominal: Soft. Normal appearance and bowel sounds are normal. She exhibits no distension. There is no tenderness. There is no rigidity, no rebound, no guarding and no CVA tenderness.  Musculoskeletal: Normal range of motion. She exhibits no edema and no tenderness.  Neurological: She is alert and oriented to person, place, and time. She has normal strength. No cranial nerve deficit or sensory deficit. GCS eye subscore is 4. GCS verbal subscore is 5. GCS motor subscore is 6.  Skin: Skin is warm and  dry. No abrasion and no rash noted.  Psychiatric: She has a normal mood and affect. Her speech is normal and behavior is normal.    ED Course  Procedures (including critical care time)  Labs Reviewed  RAPID STREP SCREEN - Abnormal; Notable for the following:    Streptococcus, Group A Screen (Direct) POSITIVE (*)    All other components within normal limits  BASIC METABOLIC PANEL - Abnormal; Notable for the following:    BUN 5 (*)    All other components within normal limits  URINALYSIS, ROUTINE W REFLEX MICROSCOPIC  - Abnormal; Notable for the following:    APPearance HAZY (*)    Leukocytes, UA SMALL (*)    All other components within normal limits  CBC WITH DIFFERENTIAL - Abnormal; Notable for the following:    Hemoglobin 11.6 (*)    HCT 32.6 (*)    All other components within normal limits  URINE MICROSCOPIC-ADD ON  POCT PREGNANCY, URINE   No results found.   No diagnosis found.    MDM  Patient given Bicillin for her strep throat. Will be discharged home        Toy Baker, MD 03/04/13 1614

## 2013-04-08 ENCOUNTER — Emergency Department (HOSPITAL_COMMUNITY): Payer: Self-pay

## 2013-04-08 ENCOUNTER — Emergency Department (HOSPITAL_COMMUNITY)
Admission: EM | Admit: 2013-04-08 | Discharge: 2013-04-08 | Disposition: A | Discharge status: Home / Self Care | Payer: Self-pay | Attending: Emergency Medicine | Admitting: Emergency Medicine

## 2013-04-08 ENCOUNTER — Encounter (HOSPITAL_COMMUNITY): Payer: Self-pay | Admitting: *Deleted

## 2013-04-08 DIAGNOSIS — M2550 Pain in unspecified joint: Secondary | ICD-10-CM | POA: Insufficient documentation

## 2013-04-08 DIAGNOSIS — M2540 Effusion, unspecified joint: Secondary | ICD-10-CM | POA: Insufficient documentation

## 2013-04-08 DIAGNOSIS — L03119 Cellulitis of unspecified part of limb: Secondary | ICD-10-CM | POA: Insufficient documentation

## 2013-04-08 DIAGNOSIS — L0390 Cellulitis, unspecified: Secondary | ICD-10-CM

## 2013-04-08 DIAGNOSIS — M79671 Pain in right foot: Secondary | ICD-10-CM

## 2013-04-08 DIAGNOSIS — IMO0001 Reserved for inherently not codable concepts without codable children: Secondary | ICD-10-CM | POA: Insufficient documentation

## 2013-04-08 DIAGNOSIS — M79609 Pain in unspecified limb: Secondary | ICD-10-CM | POA: Insufficient documentation

## 2013-04-08 DIAGNOSIS — R269 Unspecified abnormalities of gait and mobility: Secondary | ICD-10-CM | POA: Insufficient documentation

## 2013-04-08 DIAGNOSIS — L0291 Cutaneous abscess, unspecified: Secondary | ICD-10-CM

## 2013-04-08 DIAGNOSIS — L02619 Cutaneous abscess of unspecified foot: Secondary | ICD-10-CM | POA: Insufficient documentation

## 2013-04-08 DIAGNOSIS — F17200 Nicotine dependence, unspecified, uncomplicated: Secondary | ICD-10-CM | POA: Insufficient documentation

## 2013-04-08 DIAGNOSIS — M7989 Other specified soft tissue disorders: Secondary | ICD-10-CM | POA: Insufficient documentation

## 2013-04-08 LAB — CBC WITH DIFFERENTIAL/PLATELET
Basophils Absolute: 0 10*3/uL (ref 0.0–0.1)
Basophils Relative: 0 % (ref 0–1)
Eosinophils Relative: 3 % (ref 0–5)
HCT: 34.8 % — ABNORMAL LOW (ref 36.0–46.0)
Lymphocytes Relative: 48 % — ABNORMAL HIGH (ref 12–46)
MCHC: 35.3 g/dL (ref 30.0–36.0)
MCV: 78.7 fL (ref 78.0–100.0)
Monocytes Absolute: 0.6 10*3/uL (ref 0.1–1.0)
Platelets: 217 10*3/uL (ref 150–400)
RDW: 13.4 % (ref 11.5–15.5)
WBC: 7.7 10*3/uL (ref 4.0–10.5)

## 2013-04-08 LAB — BASIC METABOLIC PANEL
BUN: 8 mg/dL (ref 6–23)
CO2: 27 mEq/L (ref 19–32)
Calcium: 9.1 mg/dL (ref 8.4–10.5)
Chloride: 100 mEq/L (ref 96–112)
Creatinine, Ser: 0.69 mg/dL (ref 0.50–1.10)
GFR calc Af Amer: >90 mL/min (ref >90)

## 2013-04-08 MED ORDER — OXYCODONE-ACETAMINOPHEN 5-325 MG PO TABS: ORAL_TABLET | ORAL | Status: DC

## 2013-04-08 MED ORDER — TRAMADOL HCL 50 MG PO TABS
50.0000 mg | ORAL_TABLET | Freq: Once | ORAL | Status: AC
  Administered 2013-04-08: 50 mg via ORAL
  Filled 2013-04-08: qty 1

## 2013-04-08 MED ORDER — MORPHINE SULFATE 4 MG/ML IJ SOLN: 4.0000 mg | Freq: Once | INTRAMUSCULAR | Status: DC

## 2013-04-08 MED ORDER — SULFAMETHOXAZOLE-TRIMETHOPRIM 800-160 MG PO TABS: 1.0000 | ORAL_TABLET | Freq: Two times a day (BID) | ORAL | Status: DC

## 2013-04-08 MED ORDER — OXYCODONE-ACETAMINOPHEN 5-325 MG PO TABS
2.0000 | ORAL_TABLET | Freq: Once | ORAL | Status: AC
  Administered 2013-04-08: 2 via ORAL
  Filled 2013-04-08: qty 2

## 2013-04-08 NOTE — ED Provider Notes (Signed)
History    CSN: [id number] Arrival date & time 04/08/13  1325  First MD Initiated Contact with Patient 04/08/13 1753     Chief Complaint  Patient presents with  . Foot Pain   (Consider location/radiation/quality/duration/timing/severity/associated sxs/prior Treatment) Patient is a 27 y.o. female presenting with lower extremity pain.  Foot Pain Associated symptoms include arthralgias, joint swelling and myalgias. Pertinent negatives include no chills, fever or rash.   Pt is a 27yo female c/o right foot pain that has gradually worsened over the past 3-4 days.  Pt states she was tx in Feb as outpatient for cellulitis with clindamycin.  States she has been using "left over" antibiotics for the pain over the past few days w/o relief.  Pain is described as constant, 10/10, sharp and throbbing, worse with weight bearing.  Pt has noticed increased swelling as well as a blister on the lateral aspect of 4th toe without discharge.  Pt denies injury to the foot or opening in the skin.  Denies fever, n/v/d.  Denies hx of DM, or other significant medical hx.    Past Medical History  Diagnosis Date  . Peripheral edema    Past Surgical History  Procedure Laterality Date  . Cesarean section     Family History  Problem Relation Age of Onset  . Hypertension Other    History  Substance Use Topics  . Smoking status: Current Some Day Smoker  . Smokeless tobacco: Not on file  . Alcohol Use: 4.2 oz/week    7 Glasses of wine per week   OB History   Grav Para Term Preterm Abortions TAB SAB Ect Mult Living                 Review of Systems  Constitutional: Negative for fever and chills.  Cardiovascular: Negative for leg swelling.  Musculoskeletal: Positive for myalgias, joint swelling, arthralgias and gait problem ( pain). Negative for back pain.  Skin: Positive for color change. Negative for pallor, rash and wound.  All other systems reviewed and are negative.    Allergies  Review of  patient's allergies indicates no known allergies.  Home Medications   Current Outpatient Rx  Name  Route  Sig  Dispense  Refill  . clindamycin (CLEOCIN) 150 MG capsule   Oral   Take 150 mg by mouth every 6 (six) hours.         Marland Kitchen oxyCODONE-acetaminophen (PERCOCET/ROXICET) 5-325 MG per tablet      Take 1-2 pills every 4-6 hours as needed for pain.   10 tablet   0   . sulfamethoxazole-trimethoprim (SEPTRA DS) 800-160 MG per tablet   Oral   Take 1 tablet by mouth every 12 (twelve) hours.   20 tablet   0    BP 144/80  Pulse 91  Temp(Src) 98.7 F (37.1 C) (Oral)  Resp 18  SpO2 100%  LMP 03/08/2013 Physical Exam  Nursing note and vitals reviewed. Constitutional: She appears well-developed and well-nourished. No distress.  Obese female lying comfortably on exam bed. NAD.   HENT:  Head: Normocephalic and atraumatic.  Eyes: Conjunctivae are normal. No scleral icterus.  Neck: Normal range of motion.  Cardiovascular: Normal rate, regular rhythm and normal heart sounds.   Pulmonary/Chest: Effort normal and breath sounds normal. No respiratory distress. She has no wheezes. She has no rales. She exhibits no tenderness.  Musculoskeletal: Normal range of motion. She exhibits edema ( moderate, right foot) and tenderness ( moderate, right foot).  Feet:  Closed callused blister on lateral aspect of 4th toe  Neurological: She is alert.  Skin: Skin is warm and dry. She is not diaphoretic.  Psychiatric: She has a normal mood and affect. Her behavior is normal.    ED Course  INCISION AND DRAINAGE Date/Time: 04/08/2013 8:23 PM Performed by: Junius Finner Authorized by: Junius Finner Consent: Verbal consent not obtained. Risks and benefits: risks, benefits and alternatives were discussed Consent given by: patient Patient understanding: patient states understanding of the procedure being performed Patient consent: the patient's understanding of the procedure matches consent  given Patient identity confirmed: verbally with patient Type: abscess Body area: lower extremity Location details: right fourth toe Anesthesia: local infiltration Local anesthetic: lidocaine 1% without epinephrine Anesthetic total: 2 ml Patient sedated: no Scalpel size: 11 Needle gauge: 25. Incision type: single straight Complexity: simple Drainage: serous Drainage amount: scant Wound treatment: wound left open Packing material: none Patient tolerance: Patient tolerated the procedure well with no immediate complications.   (including critical care time) Labs Reviewed  CBC WITH DIFFERENTIAL - Abnormal; Notable for the following:    HCT 34.8 (*)    Neutrophils Relative % 41 (*)    Lymphocytes Relative 48 (*)    All other components within normal limits  WOUND CULTURE  BASIC METABOLIC PANEL   Dg Foot Complete Right  04/08/2013   *RADIOLOGY REPORT*  Clinical Data: Right foot pain and swelling in the region between the fourth and fifth toes.  RIGHT FOOT COMPLETE - 3+ VIEW  Comparison: None.  Findings: No fracture or dislocation is identified.  No bony lesions or destruction seen.  No soft tissue foreign body or gas is identified.  No significant arthropathy is identified.  IMPRESSION: Normal right foot.   Original Report Authenticated By: Irish Lack, M.D.   1. Cellulitis and abscess   2. Right foot pain     MDM  Pt c/o right foot swelling and pain.  Reports being tx for cellulitis in Feb as outpatient.  Has noticed increased swelling and pain. Concern for return of cellulitis but pt appears well, vitals: unremarkable, no leukocytosis, plain films: nl right foot.  Believe pt will be good candidate for outpatient tx. I&D performed, scant serous fluid obtained, see procedure not.  Culture collected and sent.  Will reinforce importance of completing entire prescription of antibiotics and provide pt info on PCP for f/u.    Rx: bactrim.F/u with PCP, [facility] info  provided. Return precautions given. Pt verbalized understanding and agreement with tx plan. Vitals: unremarkable. Discharged in stable condition.   Discussed pt with attending during ED encounter.    Junius Finner, PA-C 04/10/13 [phone]

## 2013-04-08 NOTE — Progress Notes (Signed)
   CARE MANAGEMENT ED NOTE 04/08/2013  Patient:  Michele Becker, Michele Becker   Account Number:  [id number]  Date Initiated:  04/08/2013  Documentation initiated by:  [facility]  Subjective/Objective Assessment:   Rt foot pain     Subjective/Objective Assessment Detail:     Action/Plan:   Follow up appt. [facility]   Action/Plan Detail:   Anticipated DC Date:  04/08/2013     Status Recommendation to Physician:   Result of Recommendation:      DC Planning Services  CM consult  PCP issues    Choice offered to / List presented to:            Status of service:  Completed, signed off  ED Comments:   ED Comments Detail:  04/08/13 21:16 W. Aundria Rud RN ED CM noted pt not to have PCP, or health insurance.  Pt presents to ED with Rt. Foot pain.  In to speak with patient with permission.   Pt states, that she does not have a PCP, or Insurance. Provided patient with verbal and written information regarding [facility] enrollment. Also provided patient with information on [facility]. With patient's permission request for f/u appointment with the Adult clinic was initiated. Informed patient that someone from the clinic will contact her in regards to an appointment at the number listed [phone]. Instructed patient to call the clinic if she is not contacted within the next 2 days. Pt states, she does not have a problem obtaining her medications. No further CM needs identified   Beecher Mcardle RN BSN

## 2013-04-08 NOTE — ED Notes (Signed)
PT was seen here in February for cellulitis in her right foot.  PT has discoloration and appears to have infection in 4th toe.  Pulse present

## 2013-04-09 ENCOUNTER — Telehealth: Payer: Self-pay | Admitting: General Practice

## 2013-04-09 NOTE — Progress Notes (Signed)
Pt has abscess between R 4th and 5th toes.  Lab test shows she is not diabetic.  Advised I&D, Rx with ABX.

## 2013-04-09 NOTE — Progress Notes (Signed)
04/09/13 at 19:00 W. Aundria Rud RN  Received email from Fay Records in [facility]. Pt has an appt. for [employer] and with PCP on 04/10/13.

## 2013-04-10 NOTE — ED Provider Notes (Signed)
Medical screening examination/treatment/procedure(s) were conducted as a shared visit with non-physician practitioner(s) and myself.  I personally evaluated the patient during the encounter Pt has abscess between R 4th and 5th toes. Lab test shows she is not diabetic. Advised I&D, Rx with ABX.        Carleene Cooper III, MD 04/10/13 [phone]

## 2013-04-11 LAB — WOUND CULTURE: Special Requests: NORMAL

## 2013-04-11 NOTE — ED Notes (Signed)
Post ED Visit - Positive Culture Follow-up  Culture report reviewed by antimicrobial stewardship pharmacist: []  Wes Dulaney, Pharm.D., BCPS [x]  Celedonio Miyamoto, Pharm.D., BCPS []  Georgina Pillion, Pharm.D., BCPS []  [place], [address].D., BCPS, AAHIVP []  Estella Husk, Pharm.D., BCPS, AAHIVP  Positive wound culture Treated with Clindamycin/Sulfa-Trim, organism sensitive to the same and no further patient follow-up is required at this time.  Larena Sox 04/11/2013, 2:49 PM

## 2013-05-02 ENCOUNTER — Encounter (HOSPITAL_COMMUNITY): Payer: Self-pay | Admitting: Emergency Medicine

## 2013-05-02 ENCOUNTER — Emergency Department (INDEPENDENT_AMBULATORY_CARE_PROVIDER_SITE_OTHER)
Admission: EM | Admit: 2013-05-02 | Discharge: 2013-05-02 | Disposition: A | Discharge status: Home / Self Care | Payer: Medicaid Other | Source: Home / Self Care

## 2013-05-02 DIAGNOSIS — Z331 Pregnant state, incidental: Secondary | ICD-10-CM

## 2013-05-02 DIAGNOSIS — Z3490 Encounter for supervision of normal pregnancy, unspecified, unspecified trimester: Secondary | ICD-10-CM

## 2013-05-02 DIAGNOSIS — R110 Nausea: Secondary | ICD-10-CM

## 2013-05-02 LAB — POCT PREGNANCY, URINE: Preg Test, Ur: POSITIVE — AB

## 2013-05-02 MED ORDER — PROMETHAZINE HCL 25 MG PO TABS: 25.0000 mg | ORAL_TABLET | Freq: Four times a day (QID) | ORAL | Status: DC | PRN

## 2013-05-02 NOTE — ED Notes (Signed)
Pt c/o abdominal pain x 1 week. Has been constipated x 2-3 days. Had a bowel movement this morning. Has been using stool softeners with mild relief. Pt also c/o no menstrual period since May. Reports sometimes she is irregular. Pt is alert and oriented.

## 2013-05-02 NOTE — ED Provider Notes (Signed)
Michele Becker is a 27 y.o. female who presents to Urgent Care today for mild abdominal pain and nausea associated with constipation for one week. Patient has infrequent bowel movements and has been constipated this week. She's had one or 2 firm bowel movements this week most recently this morning. She denies any blood in her stool vomiting fevers chills. She denies any dysuria or vaginal discharge or bleeding. She notes her last period was in May however she is typically irregular.  No chronic medications. She's tried a stool softener which helped a bit.    PMH reviewed. Obese.   History  Substance Use Topics  . Smoking status: Never Smoker   . Smokeless tobacco: Not on file  . Alcohol Use: No   ROS as above Medications reviewed. No current facility-administered medications for this encounter.   Current Outpatient Prescriptions  Medication Sig Dispense Refill  . promethazine (PHENERGAN) 25 MG tablet Take 1 tablet (25 mg total) by mouth every 6 (six) hours as needed for nausea.  30 tablet  0    Exam:  BP 122/54  Pulse 74  Temp(Src) 98.2 F (36.8 C) (Oral)  Resp 16  SpO2 100%  LMP 03/08/2013 Gen: Well NAD HEENT: EOMI,  MMM Lungs: CTABL Nl WOB Heart: RRR no MRG Abd: NABS, NT, ND Exts: Non edematous BL  LE, warm and well perfused.   Results for orders placed during the hospital encounter of 05/02/13 (from the past 24 hour(s))  POCT PREGNANCY, URINE     Status: Abnormal   Collection Time    05/02/13  9:34 AM      Result Value Range   Preg Test, Ur POSITIVE (*) NEGATIVE   No results found.  Assessment and Plan: 27 y.o. female with nausea with positive pregnancy test.  Likely mornings sickness.  Plan: Phenergan as needed for nausea Prenatal vitamins Referral to OB/GYN Also discussed possibility of abortion and provided information for [facility].  Attestation letter for pregnancy Medicaid.      Rodolph Bong, MD 05/02/13 [phone]

## 2013-06-15 ENCOUNTER — Encounter (HOSPITAL_COMMUNITY): Payer: Self-pay | Admitting: *Deleted

## 2013-06-15 ENCOUNTER — Inpatient Hospital Stay (HOSPITAL_COMMUNITY)
Admission: AD | Admit: 2013-06-15 | Discharge: 2013-06-15 | Disposition: A | Discharge status: Home / Self Care | Payer: Medicaid Other | Source: Ambulatory Visit | Attending: Obstetrics & Gynecology | Admitting: Obstetrics & Gynecology

## 2013-06-15 DIAGNOSIS — O2390 Unspecified genitourinary tract infection in pregnancy, unspecified trimester: Secondary | ICD-10-CM | POA: Insufficient documentation

## 2013-06-15 DIAGNOSIS — A499 Bacterial infection, unspecified: Secondary | ICD-10-CM | POA: Insufficient documentation

## 2013-06-15 DIAGNOSIS — B9689 Other specified bacterial agents as the cause of diseases classified elsewhere: Secondary | ICD-10-CM

## 2013-06-15 DIAGNOSIS — A599 Trichomoniasis, unspecified: Secondary | ICD-10-CM

## 2013-06-15 DIAGNOSIS — A5901 Trichomonal vulvovaginitis: Secondary | ICD-10-CM | POA: Insufficient documentation

## 2013-06-15 DIAGNOSIS — N760 Acute vaginitis: Secondary | ICD-10-CM | POA: Insufficient documentation

## 2013-06-15 DIAGNOSIS — R109 Unspecified abdominal pain: Secondary | ICD-10-CM | POA: Insufficient documentation

## 2013-06-15 DIAGNOSIS — R51 Headache: Secondary | ICD-10-CM | POA: Insufficient documentation

## 2013-06-15 DIAGNOSIS — O98819 Other maternal infectious and parasitic diseases complicating pregnancy, unspecified trimester: Secondary | ICD-10-CM | POA: Insufficient documentation

## 2013-06-15 DIAGNOSIS — O210 Mild hyperemesis gravidarum: Secondary | ICD-10-CM | POA: Insufficient documentation

## 2013-06-15 HISTORY — DX: Headache: R51

## 2013-06-15 LAB — WET PREP, GENITAL
Trich, Wet Prep: NONE SEEN
Yeast Wet Prep HPF POC: NONE SEEN

## 2013-06-15 LAB — URINALYSIS, ROUTINE W REFLEX MICROSCOPIC
Glucose, UA: NEGATIVE mg/dL
Hgb urine dipstick: NEGATIVE
Specific Gravity, Urine: 1.02 (ref 1.005–1.030)
Urobilinogen, UA: 0.2 mg/dL (ref 0.0–1.0)

## 2013-06-15 LAB — URINE MICROSCOPIC-ADD ON

## 2013-06-15 MED ORDER — ACETAMINOPHEN 500 MG PO TABS
1000.0000 mg | ORAL_TABLET | Freq: Once | ORAL | Status: AC
  Administered 2013-06-15: 1000 mg via ORAL
  Filled 2013-06-15: qty 2

## 2013-06-15 MED ORDER — ONDANSETRON 4 MG PO TBDP
4.0000 mg | ORAL_TABLET | Freq: Once | ORAL | Status: AC
  Administered 2013-06-15: 4 mg via ORAL
  Filled 2013-06-15: qty 1

## 2013-06-15 MED ORDER — METRONIDAZOLE 500 MG PO TABS: 500.0000 mg | ORAL_TABLET | Freq: Two times a day (BID) | ORAL | Status: DC

## 2013-06-15 NOTE — MAU Note (Signed)
No adverse reaction from zofran or tylenol

## 2013-06-15 NOTE — MAU Note (Signed)
Tries to drink all the time, however has had a hard time keeping foods and drinks down. Headache is generalized. Also notes clear, non-odorous vaginal discharge. Notes urgency with voiding.

## 2013-06-15 NOTE — MAU Note (Signed)
Pt c/o headache on and off for the past week. Not taken any pain meds for it . Also c/o intermitant  sharp  lower abd pain. Positive pregnancy test in urgent care in July.

## 2013-06-15 NOTE — MAU Provider Note (Signed)
History     CSN: [id number]  Arrival date and time: 06/15/13 1041   First Provider Initiated Contact with Patient 06/15/13 1125      Chief Complaint  Patient presents with  . Headache   HPI  Ms. Michele Becker is a 27 y.o. female; G3P1011 at [redacted]w[redacted]d who presents with Nausea, headache and lower abdominal cramping with a small amount of non odorous discharge. The discharge and cramping started approximately 1 week ago. She has one partner times "a few months". She has a history of headaches as a child/teenage, however has not needed treatment since then. She feels that since she became pregnant her headaches have become worse. Medicaid is pending and she plans to go to [facility] for prenatal care.    OB History   Grav Para Term Preterm Abortions TAB SAB Ect Mult Living   3 1 1  1  1   1       Past Medical History  Diagnosis Date  . Peripheral edema   . Headache(784.0)     Migraines as child    Past Surgical History  Procedure Laterality Date  . Wisdom tooth extraction    . Cesarean section      Pt states no fluid around baby    Family History  Problem Relation Age of Onset  . Hypertension Other     History  Substance Use Topics  . Smoking status: Former Games developer  . Smokeless tobacco: Never Used  . Alcohol Use: No    Allergies: No Known Allergies  Prescriptions prior to admission  Medication Sig Dispense Refill  . promethazine (PHENERGAN) 25 MG tablet Take 1 tablet (25 mg total) by mouth every 6 (six) hours as needed for nausea.  30 tablet  0   Results for orders placed during the hospital encounter of 06/15/13 (from the past 24 hour(s))  URINALYSIS, ROUTINE W REFLEX MICROSCOPIC     Status: Abnormal   Collection Time    06/15/13 11:00 AM      Result Value Range   Color, Urine YELLOW  YELLOW   APPearance HAZY (*) CLEAR   Specific Gravity, Urine 1.020  1.005 - 1.030   pH 8.0  5.0 - 8.0   Glucose, UA NEGATIVE  NEGATIVE mg/dL   Hgb urine dipstick  NEGATIVE  NEGATIVE   Bilirubin Urine NEGATIVE  NEGATIVE   Ketones, ur NEGATIVE  NEGATIVE mg/dL   Protein, ur NEGATIVE  NEGATIVE mg/dL   Urobilinogen, UA 0.2  0.0 - 1.0 mg/dL   Nitrite NEGATIVE  NEGATIVE   Leukocytes, UA MODERATE (*) NEGATIVE  URINE MICROSCOPIC-ADD ON     Status: Abnormal   Collection Time    06/15/13 11:00 AM      Result Value Range   Squamous Epithelial / LPF MANY (*) RARE   WBC, UA 11-20  <3 WBC/hpf   RBC / HPF 0-2  <3 RBC/hpf   Bacteria, UA MANY (*) RARE   Urine-Other TRICHOMONAS PRESENT    WET PREP, GENITAL     Status: Abnormal   Collection Time    06/15/13 11:35 AM      Result Value Range   Yeast Wet Prep HPF POC NONE SEEN  NONE SEEN   Trich, Wet Prep NONE SEEN  NONE SEEN   Clue Cells Wet Prep HPF POC MODERATE (*) NONE SEEN   WBC, Wet Prep HPF POC FEW (*) NONE SEEN    Review of Systems  Constitutional: Negative for fever and chills.  HENT:  Has not taken anything at home for the HA  Eyes: Negative for blurred vision.  Gastrointestinal: Positive for nausea, vomiting and abdominal pain. Negative for heartburn, diarrhea and constipation.       Lower bilateral abdominal cramping, Left sided pain > right side  Genitourinary: Negative for dysuria, urgency and frequency.  Musculoskeletal: Positive for back pain.       Lower back pain occurs when sitting many hours at her job.   Neurological: Positive for headaches. Negative for dizziness.   Physical Exam   Blood pressure 126/75, pulse 93, temperature 98.1 F (36.7 C), temperature source Oral, resp. rate 18, height 5\' 7"  (1.702 m), weight 113.49 kg (250 lb 3.2 oz), last menstrual period 03/07/2013.  Doppler Heart tones by RN: 144 bpm   Physical Exam  Constitutional: She is oriented to person, place, and time. She appears well-developed and well-nourished. No distress.  Neck: Neck supple.  Respiratory: Effort normal.  GI: Soft. She exhibits no distension. There is tenderness. There is no rebound and  no guarding.  Bilateral lower abdominal tenderness   Genitourinary: Vaginal discharge found.  Speculum exam: Vagina - Small amount of white; mucous discharge, no odor Cervix - No contact bleeding; small amount of stringy, clear discharge at cervical os Bimanual exam: Cervix closed, no cervical motion tenderness  Uterus non tender, normal size for gestational age Bilateral Adnexal tenderness, no masses bilaterally GC/Chlam, wet prep done Chaperone present for exam.   Neurological: She is alert and oriented to person, place, and time.  Skin: Skin is warm. She is not diaphoretic.  Psychiatric: Her behavior is normal.    MAU Course  Procedures  UA: positive for trichomonas  Wet prep GC/chlamydia- pending   Tylenol 1 gram PO X1 Zofran ODT 4 mg PO X 1  HA improved from tylenol.  Assessment and Plan  A: Trichomonas  Bacterial vaginosis   P: Discharge home RX: Flagyl 500 mg PO BID times 7 days (#14) no RF Ok to take over the counter tylenol 650 mg PO Q6 hours as needed for headache Start prenatal care as soon as possible  You have been diagnosed with a sexually transmitted disease. Your need to be treated and your partner(s) will need to be treated.  No sex until 10 days after you finished your medicine and no sex until 10 days after your partner has taken their medication. Return to MAU if symptoms worsen or do not improve after treatment  Support given   Venia Carbon IRENE FNP-C 06/15/2013, 12:46 PM

## 2013-06-16 LAB — URINE CULTURE

## 2013-06-16 LAB — GC/CHLAMYDIA PROBE AMP: CT Probe RNA: NEGATIVE

## 2013-07-22 LAB — OB RESULTS CONSOLE ANTIBODY SCREEN: ANTIBODY SCREEN: NEGATIVE

## 2013-07-22 LAB — OB RESULTS CONSOLE GBS: GBS: POSITIVE

## 2013-07-22 LAB — OB RESULTS CONSOLE ABO/RH: RH Type: POSITIVE

## 2013-07-22 LAB — OB RESULTS CONSOLE GC/CHLAMYDIA
Chlamydia: NEGATIVE
GC PROBE AMP, GENITAL: NEGATIVE

## 2013-07-22 LAB — OB RESULTS CONSOLE RUBELLA ANTIBODY, IGM: RUBELLA: IMMUNE

## 2013-07-22 LAB — OB RESULTS CONSOLE HIV ANTIBODY (ROUTINE TESTING): HIV: NONREACTIVE

## 2013-07-22 LAB — OB RESULTS CONSOLE HEPATITIS B SURFACE ANTIGEN: HEP B S AG: NEGATIVE

## 2013-07-22 LAB — OB RESULTS CONSOLE RPR: RPR: NONREACTIVE

## 2013-07-23 ENCOUNTER — Encounter: Payer: Medicaid Other | Admitting: Family Medicine

## 2013-10-02 ENCOUNTER — Inpatient Hospital Stay (HOSPITAL_COMMUNITY)
Admission: AD | Admit: 2013-10-02 | Discharge: 2013-10-02 | Disposition: A | Discharge status: Home / Self Care | Payer: Medicaid Other | Source: Ambulatory Visit | Attending: Obstetrics and Gynecology | Admitting: Obstetrics and Gynecology

## 2013-10-02 ENCOUNTER — Encounter (HOSPITAL_COMMUNITY): Payer: Self-pay | Admitting: General Practice

## 2013-10-02 DIAGNOSIS — O479 False labor, unspecified: Secondary | ICD-10-CM

## 2013-10-02 DIAGNOSIS — B9689 Other specified bacterial agents as the cause of diseases classified elsewhere: Secondary | ICD-10-CM | POA: Insufficient documentation

## 2013-10-02 DIAGNOSIS — N760 Acute vaginitis: Secondary | ICD-10-CM | POA: Insufficient documentation

## 2013-10-02 DIAGNOSIS — N949 Unspecified condition associated with female genital organs and menstrual cycle: Secondary | ICD-10-CM | POA: Insufficient documentation

## 2013-10-02 DIAGNOSIS — A499 Bacterial infection, unspecified: Secondary | ICD-10-CM | POA: Insufficient documentation

## 2013-10-02 DIAGNOSIS — R102 Pelvic and perineal pain: Secondary | ICD-10-CM

## 2013-10-02 DIAGNOSIS — O2390 Unspecified genitourinary tract infection in pregnancy, unspecified trimester: Secondary | ICD-10-CM | POA: Insufficient documentation

## 2013-10-02 LAB — URINALYSIS, ROUTINE W REFLEX MICROSCOPIC
Bilirubin Urine: NEGATIVE
Glucose, UA: NEGATIVE mg/dL
Hgb urine dipstick: NEGATIVE
Specific Gravity, Urine: >1.03 — ABNORMAL HIGH (ref 1.005–1.030)
Urobilinogen, UA: 0.2 mg/dL (ref 0.0–1.0)
pH: 6.5 (ref 5.0–8.0)

## 2013-10-02 LAB — WET PREP, GENITAL
Trich, Wet Prep: NONE SEEN
Yeast Wet Prep HPF POC: NONE SEEN

## 2013-10-02 NOTE — MAU Note (Signed)
Sharp, shooting pains and pressure in vagina.  Happened one time last wk. Had 2 today, called dr, but did not hear back.

## 2013-10-02 NOTE — MAU Note (Signed)
Pt states feeling sharp shooting pains. States feeling two sharp shooting pain into her vagina today on the left side. The pains were 1 hour apart. States last week she felt the pain on her left side.

## 2013-10-02 NOTE — MAU Provider Note (Signed)
History     CSN: [id number]  Arrival date and time: 10/02/13 1729   None     Chief Complaint  Patient presents with  . Vaginal Pain   Vaginal Pain The patient's pertinent negatives include no genital itching, genital lesions, genital odor, vaginal bleeding or vaginal discharge. This is a new problem. The current episode started today. The problem occurs intermittently. The problem affects the left side. She is pregnant. Associated symptoms include headaches, nausea and vomiting. Pertinent negatives include no abdominal pain, chills, constipation, diarrhea, dysuria, fever or urgency.    Pt is G3P1011 @[redacted]w[redacted]d  who presents with sharp shooting pain down into vagina.  The pain is infrequent and sharp and short duration.Pt denies vaginal discharge,Spotting or bleeding.  Pt denies pain with urination, constipation, or diarrhea.  Pt had normal bowel movement today. Pt is not having contractions; baby is active.   Past Medical History  Diagnosis Date  . Peripheral edema   . Headache(784.0)     Migraines as child    Past Surgical History  Procedure Laterality Date  . Wisdom tooth extraction    . Cesarean section      Pt states no fluid around baby    Family History  Problem Relation Age of Onset  . Hypertension Other     History  Substance Use Topics  . Smoking status: Former Games developer  . Smokeless tobacco: Never Used  . Alcohol Use: No    Allergies: No Known Allergies  Prescriptions prior to admission  Medication Sig Dispense Refill  . metroNIDAZOLE (FLAGYL) 500 MG tablet Take 1 tablet (500 mg total) by mouth 2 (two) times daily.  14 tablet  0  . Prenatal Vit-Fe Fumarate-FA (PRENATAL MULTIVITAMIN) TABS tablet Take 1 tablet by mouth daily at 12 noon.        Review of Systems  Constitutional: Negative for fever and chills.  HENT:       Infrequent headaches but severe when occurs; headache earlier today but wearing off.  Eyes: Negative for blurred vision.   Gastrointestinal: Positive for nausea and vomiting. Negative for abdominal pain, diarrhea and constipation.       Vomiting usually in the morning  Genitourinary: Positive for vaginal pain. Negative for dysuria, urgency and vaginal discharge.  Neurological: Positive for headaches.   Physical Exam   Blood pressure 122/70, pulse 94, temperature 98.9 F (37.2 C), temperature source Oral, resp. rate 18, weight 120.203 kg (265 lb), last menstrual period 03/07/2013.  Physical Exam  Nursing note and vitals reviewed. Constitutional: She is oriented to person, place, and time. She appears well-developed and well-nourished. No distress.  HENT:  Head: Normocephalic.  Eyes: Pupils are equal, round, and reactive to light.  Neck: Normal range of motion.  Cardiovascular: Normal rate.   Respiratory: Effort normal.  GI: Soft. She exhibits no distension. There is no tenderness. There is no rebound.  FHR reassuring for gestational age  Genitourinary:  Mod amount of frothy yellow discharge in vault; cervix long closed  Musculoskeletal: Normal range of motion.  Neurological: She is alert and oriented to person, place, and time.  Skin: Skin is warm and dry.  Psychiatric: She has a normal mood and affect.    MAU Course  Procedures  Results for orders placed during the hospital encounter of 10/02/13 (from the past 24 hour(s))  URINALYSIS, ROUTINE W REFLEX MICROSCOPIC     Status: Abnormal   Collection Time    10/02/13  5:50 PM      Result Value  Range   Color, Urine YELLOW  YELLOW   APPearance CLEAR  CLEAR   Specific Gravity, Urine >1.030 (*) 1.005 - 1.030   pH 6.5  5.0 - 8.0   Glucose, UA NEGATIVE  NEGATIVE mg/dL   Hgb urine dipstick NEGATIVE  NEGATIVE   Bilirubin Urine NEGATIVE  NEGATIVE   Ketones, ur NEGATIVE  NEGATIVE mg/dL   Protein, ur NEGATIVE  NEGATIVE mg/dL   Urobilinogen, UA 0.2  0.0 - 1.0 mg/dL   Nitrite NEGATIVE  NEGATIVE   Leukocytes, UA NEGATIVE  NEGATIVE  WET PREP, GENITAL      Status: Abnormal   Collection Time    10/02/13  6:52 PM      Result Value Range   Yeast Wet Prep HPF POC NONE SEEN  NONE SEEN   Trich, Wet Prep NONE SEEN  NONE SEEN   Clue Cells Wet Prep HPF POC FEW (*) NONE SEEN   WBC, Wet Prep HPF POC FEW (*) NONE SEEN  discussed with Dr. Greta Doom   Assessment and Plan  Vaginal pain BV- Flagyl 500mg  BID for 7 days  Yannely Kintzel 10/02/2013, 6:25 PM

## 2013-10-03 LAB — GC/CHLAMYDIA PROBE AMP
CT Probe RNA: NEGATIVE
GC Probe RNA: NEGATIVE

## 2013-12-08 ENCOUNTER — Encounter (HOSPITAL_COMMUNITY): Payer: Medicaid Other | Admitting: Anesthesiology

## 2013-12-08 ENCOUNTER — Encounter (HOSPITAL_COMMUNITY)
Admission: AD | Disposition: A | Discharge status: Home / Self Care | Payer: Self-pay | Source: Ambulatory Visit | Attending: Obstetrics and Gynecology

## 2013-12-08 ENCOUNTER — Inpatient Hospital Stay (HOSPITAL_COMMUNITY)
Admission: AD | Admit: 2013-12-08 | Discharge: 2013-12-11 | DRG: 765 | Disposition: A | Discharge status: Home / Self Care | Payer: Medicaid Other | Source: Ambulatory Visit | Attending: Obstetrics and Gynecology | Admitting: Obstetrics and Gynecology

## 2013-12-08 ENCOUNTER — Inpatient Hospital Stay (HOSPITAL_COMMUNITY): Payer: Medicaid Other | Admitting: Anesthesiology

## 2013-12-08 ENCOUNTER — Encounter (HOSPITAL_COMMUNITY): Payer: Self-pay | Admitting: *Deleted

## 2013-12-08 ENCOUNTER — Encounter (HOSPITAL_COMMUNITY): Payer: Self-pay | Admitting: Pharmacist

## 2013-12-08 DIAGNOSIS — D573 Sickle-cell trait: Secondary | ICD-10-CM | POA: Diagnosis present

## 2013-12-08 DIAGNOSIS — Z98891 History of uterine scar from previous surgery: Secondary | ICD-10-CM

## 2013-12-08 DIAGNOSIS — K219 Gastro-esophageal reflux disease without esophagitis: Secondary | ICD-10-CM | POA: Diagnosis present

## 2013-12-08 DIAGNOSIS — O99892 Other specified diseases and conditions complicating childbirth: Secondary | ICD-10-CM | POA: Diagnosis present

## 2013-12-08 DIAGNOSIS — O9902 Anemia complicating childbirth: Secondary | ICD-10-CM | POA: Diagnosis present

## 2013-12-08 DIAGNOSIS — E669 Obesity, unspecified: Secondary | ICD-10-CM | POA: Diagnosis present

## 2013-12-08 DIAGNOSIS — Z87891 Personal history of nicotine dependence: Secondary | ICD-10-CM

## 2013-12-08 DIAGNOSIS — Z22330 Carrier of Group B streptococcus: Secondary | ICD-10-CM

## 2013-12-08 DIAGNOSIS — A6000 Herpesviral infection of urogenital system, unspecified: Secondary | ICD-10-CM | POA: Diagnosis present

## 2013-12-08 DIAGNOSIS — O34219 Maternal care for unspecified type scar from previous cesarean delivery: Principal | ICD-10-CM | POA: Diagnosis present

## 2013-12-08 DIAGNOSIS — O99214 Obesity complicating childbirth: Secondary | ICD-10-CM

## 2013-12-08 DIAGNOSIS — O9989 Other specified diseases and conditions complicating pregnancy, childbirth and the puerperium: Secondary | ICD-10-CM

## 2013-12-08 DIAGNOSIS — O98519 Other viral diseases complicating pregnancy, unspecified trimester: Secondary | ICD-10-CM | POA: Diagnosis present

## 2013-12-08 HISTORY — DX: Anogenital herpesviral infection, unspecified: A60.9

## 2013-12-08 HISTORY — DX: Herpesviral infection of urogenital system, unspecified: A60.00

## 2013-12-08 HISTORY — DX: Sickle-cell trait: D57.3

## 2013-12-08 HISTORY — DX: Acute vaginitis: N76.0

## 2013-12-08 HISTORY — DX: Other specified bacterial agents as the cause of diseases classified elsewhere: B96.89

## 2013-12-08 HISTORY — DX: Trichomonal vulvovaginitis: A59.01

## 2013-12-08 LAB — COMPREHENSIVE METABOLIC PANEL
ALBUMIN: 2.7 g/dL — AB (ref 3.5–5.2)
ALT: 12 U/L (ref 0–35)
AST: 17 U/L (ref 0–37)
Alkaline Phosphatase: 150 U/L — ABNORMAL HIGH (ref 39–117)
BILIRUBIN TOTAL: 0.2 mg/dL — AB (ref 0.3–1.2)
BUN: 9 mg/dL (ref 6–23)
CHLORIDE: 100 meq/L (ref 96–112)
CO2: 21 mEq/L (ref 19–32)
CREATININE: 0.52 mg/dL (ref 0.50–1.10)
Calcium: 8.8 mg/dL (ref 8.4–10.5)
GFR calc Af Amer: >90 mL/min (ref >90)
GFR calc non Af Amer: >90 mL/min (ref >90)
Glucose, Bld: 79 mg/dL (ref 70–99)
POTASSIUM: 4 meq/L (ref 3.7–5.3)
SODIUM: 134 meq/L — AB (ref 137–147)
Total Protein: 6.8 g/dL (ref 6.0–8.3)

## 2013-12-08 LAB — TYPE AND SCREEN
ABO/RH(D): O POS
ANTIBODY SCREEN: NEGATIVE

## 2013-12-08 LAB — CBC
HEMATOCRIT: 31.4 % — AB (ref 36.0–46.0)
HEMOGLOBIN: 11.3 g/dL — AB (ref 12.0–15.0)
MCH: 28 pg (ref 26.0–34.0)
MCHC: 36 g/dL (ref 30.0–36.0)
MCV: 77.7 fL — AB (ref 78.0–100.0)
Platelets: 140 10*3/uL — ABNORMAL LOW (ref 150–400)
RBC: 4.04 MIL/uL (ref 3.87–5.11)
RDW: 13.4 % (ref 11.5–15.5)
WBC: 7.8 10*3/uL (ref 4.0–10.5)

## 2013-12-08 SURGERY — Surgical Case
Anesthesia: Spinal | Site: Abdomen

## 2013-12-08 MED ORDER — LACTATED RINGERS IV SOLN
INTRAVENOUS | Status: DC
  Administered 2013-12-08 (×2): via INTRAVENOUS

## 2013-12-08 MED ORDER — LACTATED RINGERS IV SOLN
INTRAVENOUS | Status: DC | PRN
Start: 2013-12-08 — End: 2013-12-09
  Administered 2013-12-08: via INTRAVENOUS

## 2013-12-08 MED ORDER — OXYTOCIN 10 UNIT/ML IJ SOLN
INTRAMUSCULAR | Status: AC
  Filled 2013-12-08: qty 4

## 2013-12-08 MED ORDER — ONDANSETRON HCL 4 MG/2ML IJ SOLN
INTRAMUSCULAR | Status: AC
  Filled 2013-12-08: qty 2

## 2013-12-08 MED ORDER — BUPIVACAINE IN DEXTROSE 0.75-8.25 % IT SOLN
INTRATHECAL | Status: DC | PRN
  Administered 2013-12-08: 1.7 mL via INTRATHECAL

## 2013-12-08 MED ORDER — PHENYLEPHRINE 40 MCG/ML (10ML) SYRINGE FOR IV PUSH (FOR BLOOD PRESSURE SUPPORT)
PREFILLED_SYRINGE | INTRAVENOUS | Status: AC
  Filled 2013-12-08: qty 5

## 2013-12-08 MED ORDER — MORPHINE SULFATE 0.5 MG/ML IJ SOLN
INTRAMUSCULAR | Status: AC
  Filled 2013-12-08: qty 10

## 2013-12-08 MED ORDER — CITRIC ACID-SODIUM CITRATE 334-500 MG/5ML PO SOLN
30.0000 mL | Freq: Once | ORAL | Status: AC
  Administered 2013-12-08: 30 mL via ORAL

## 2013-12-08 MED ORDER — PHENYLEPHRINE 8 MG IN D5W 100 ML (0.08MG/ML) PREMIX OPTIME
INJECTION | INTRAVENOUS | Status: AC
  Filled 2013-12-08: qty 100

## 2013-12-08 MED ORDER — FENTANYL CITRATE 0.05 MG/ML IJ SOLN
INTRAMUSCULAR | Status: AC
  Filled 2013-12-08: qty 2

## 2013-12-08 MED ORDER — OXYTOCIN 10 UNIT/ML IJ SOLN
40.0000 [IU] | INTRAVENOUS | Status: DC | PRN
  Administered 2013-12-08: 40 [IU] via INTRAVENOUS

## 2013-12-08 MED ORDER — FENTANYL CITRATE 0.05 MG/ML IJ SOLN
INTRAMUSCULAR | Status: DC | PRN
  Administered 2013-12-08: 25 ug via INTRATHECAL

## 2013-12-08 MED ORDER — ONDANSETRON HCL 4 MG/2ML IJ SOLN
INTRAMUSCULAR | Status: DC | PRN
  Administered 2013-12-08: 4 mg via INTRAVENOUS

## 2013-12-08 MED ORDER — SCOPOLAMINE 1 MG/3DAYS TD PT72
1.0000 | MEDICATED_PATCH | Freq: Once | TRANSDERMAL | Status: DC
  Administered 2013-12-09: 1.5 mg via TRANSDERMAL

## 2013-12-08 MED ORDER — DEXTROSE 5 % IV SOLN
3.0000 g | INTRAVENOUS | Status: AC
  Administered 2013-12-08: 3 g via INTRAVENOUS
  Filled 2013-12-08: qty 3000

## 2013-12-08 MED ORDER — PHENYLEPHRINE 8 MG IN D5W 100 ML (0.08MG/ML) PREMIX OPTIME
INJECTION | INTRAVENOUS | Status: DC | PRN
  Administered 2013-12-08: 60 ug/min via INTRAVENOUS

## 2013-12-08 MED ORDER — MORPHINE SULFATE (PF) 0.5 MG/ML IJ SOLN
INTRAMUSCULAR | Status: DC | PRN
  Administered 2013-12-08: .15 mg via INTRATHECAL

## 2013-12-08 SURGICAL SUPPLY — 35 items
APL SKNCLS STERI-STRIP NONHPOA (GAUZE/BANDAGES/DRESSINGS) ×1
BENZOIN TINCTURE PRP APPL 2/3 (GAUZE/BANDAGES/DRESSINGS) ×2 IMPLANT
CLAMP CORD UMBIL (MISCELLANEOUS) IMPLANT
CLOSURE WOUND 1/2 X4 (GAUZE/BANDAGES/DRESSINGS) ×1
CLOTH BEACON ORANGE TIMEOUT ST (SAFETY) ×3 IMPLANT
DRAPE LG THREE QUARTER DISP (DRAPES) IMPLANT
DRSG OPSITE POSTOP 4X10 (GAUZE/BANDAGES/DRESSINGS) ×3 IMPLANT
DURAPREP 26ML APPLICATOR (WOUND CARE) ×3 IMPLANT
ELECT REM PT RETURN 9FT ADLT (ELECTROSURGICAL) ×3
ELECTRODE REM PT RTRN 9FT ADLT (ELECTROSURGICAL) ×1 IMPLANT
EXTRACTOR VACUUM KIWI (MISCELLANEOUS) IMPLANT
GLOVE BIO SURGEON STRL SZ 6.5 (GLOVE) ×2 IMPLANT
GLOVE BIO SURGEONS STRL SZ 6.5 (GLOVE) ×1
GOWN STRL REUS W/TWL LRG LVL3 (GOWN DISPOSABLE) ×6 IMPLANT
KIT ABG SYR 3ML LUER SLIP (SYRINGE) IMPLANT
NDL HYPO 25X5/8 SAFETYGLIDE (NEEDLE) IMPLANT
NEEDLE HYPO 25X5/8 SAFETYGLIDE (NEEDLE) IMPLANT
NS IRRIG 1000ML POUR BTL (IV SOLUTION) ×3 IMPLANT
PACK C SECTION WH (CUSTOM PROCEDURE TRAY) ×3 IMPLANT
PAD OB MATERNITY 4.3X12.25 (PERSONAL CARE ITEMS) ×3 IMPLANT
RTRCTR C-SECT PINK 25CM LRG (MISCELLANEOUS) ×3 IMPLANT
STRIP CLOSURE SKIN 1/2X4 (GAUZE/BANDAGES/DRESSINGS) ×1 IMPLANT
SUT CHROMIC 1 CTX 36 (SUTURE) ×6 IMPLANT
SUT PLAIN 0 NONE (SUTURE) IMPLANT
SUT PLAIN 2 0 XLH (SUTURE) ×3 IMPLANT
SUT VIC AB 0 CT1 27 (SUTURE) ×9
SUT VIC AB 0 CT1 27XBRD ANBCTR (SUTURE) ×2 IMPLANT
SUT VIC AB 2-0 CT1 27 (SUTURE) ×3
SUT VIC AB 2-0 CT1 TAPERPNT 27 (SUTURE) ×1 IMPLANT
SUT VIC AB 3-0 CT1 27 (SUTURE)
SUT VIC AB 3-0 CT1 TAPERPNT 27 (SUTURE) IMPLANT
SUT VIC AB 4-0 KS 27 (SUTURE) ×3 IMPLANT
TOWEL OR 17X24 6PK STRL BLUE (TOWEL DISPOSABLE) ×3 IMPLANT
TRAY FOLEY CATH 14FR (SET/KITS/TRAYS/PACK) ×3 IMPLANT
WATER STERILE IRR 1000ML POUR (IV SOLUTION) ×3 IMPLANT

## 2013-12-08 NOTE — Anesthesia Procedure Notes (Signed)
Spinal  Patient location during procedure: OR Start time: 12/08/2013 11:05 PM Staffing Anesthesiologist: Kandas Oliveto A. Performed by: anesthesiologist  Preanesthetic Checklist Completed: patient identified, site marked, surgical consent, pre-op evaluation, timeout performed, IV checked, risks and benefits discussed and monitors and equipment checked Spinal Block Patient position: sitting Prep: site prepped and draped and DuraPrep Patient monitoring: heart rate, cardiac monitor, continuous pulse ox and blood pressure Approach: midline Location: L3-4 Injection technique: single-shot Needle Needle type: Sprotte  Needle gauge: 24 G Needle length: 9 cm Assessment Sensory level: T4 Additional Notes Patient tolerated procedure well. Adequate sensory level.

## 2013-12-08 NOTE — MAU Note (Signed)
Pt through-out to room 168

## 2013-12-08 NOTE — Anesthesia Preprocedure Evaluation (Signed)
Anesthesia Evaluation  Patient identified by MRN, date of birth, ID band Patient awake    Reviewed: Allergy & Precautions, H&P , NPO status , Patient's Chart, lab work & pertinent test results  Airway Mallampati: III TM Distance: >3 FB Neck ROM: Full    Dental no notable dental hx. (+) Teeth Intact   Pulmonary former smoker,  breath sounds clear to auscultation  Pulmonary exam normal       Cardiovascular negative cardio ROS  Rhythm:Regular Rate:Normal     Neuro/Psych  Headaches, negative psych ROS   GI/Hepatic Neg liver ROS, GERD-  Medicated and Controlled,  Endo/Other  Morbid obesity  Renal/GU negative Renal ROS  negative genitourinary   Musculoskeletal negative musculoskeletal ROS (+)   Abdominal (+) + obese,   Peds  Hematology negative hematology ROS (+)   Anesthesia Other Findings   Reproductive/Obstetrics (+) Pregnancy Previous C/Section HSV                           Anesthesia Physical Anesthesia Plan  ASA: III and emergent  Anesthesia Plan: Spinal   Post-op Pain Management:    Induction:   Airway Management Planned: Natural Airway  Additional Equipment:   Intra-op Plan:   Post-operative Plan:   Informed Consent: I have reviewed the patients History and Physical, chart, labs and discussed the procedure including the risks, benefits and alternatives for the proposed anesthesia with the patient or authorized representative who has indicated his/her understanding and acceptance.     Plan Discussed with: CRNA, Anesthesiologist and Surgeon  Anesthesia Plan Comments:         Anesthesia Quick Evaluation

## 2013-12-08 NOTE — H&P (Signed)
Michele Becker is a 28 y.o. female G3P1011 at 45 1/7 weeks (EDD 12/14/13 by an 18 week [place]) presenting for SROM at about 8 pm.  Pt had a late start to prenatal care at 18 weeks, and pregnancy is complicated by a prior c-section in her last pregnancy for FTP and fetal heart rate issues.  She has been planning to attempt a VBAC and risks and benefits d/w her on numerous occasions.  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  She also is a smoke but has quit with pregnancy, and carries sickle cell trait.  She also has tested positive for GBS.  She has been measuring S>D and had an [place] planned, but has not yet had it this week.  Maternal Medical History:  Reason for admission: Rupture of membranes.   Contractions: Onset was 1-2 hours ago.   Frequency: irregular.   Perceived severity is mild.    Fetal activity: Perceived fetal activity is normal.    Prenatal Complications - Diabetes: none.    OB History   Grav Para Term Preterm Abortions TAB SAB Ect Mult Living   3 1 1  1  1   1     2007 SAB 2011 LTCS 6#7oz  Two-layer closure  Past Medical History  Diagnosis Date  . Peripheral edema   . Headache(784.0)     Migraines as child  Sickle cell Trait  Past Surgical History  Procedure Laterality Date  . Wisdom tooth extraction    . Cesarean section      Pt states no fluid around baby   Family History: family history includes Hypertension in her other. Social History:  reports that she has quit smoking. She has never used smokeless tobacco. She reports that she does not drink alcohol or use illicit drugs.   Prenatal Transfer Tool  Maternal Diabetes: No Genetic Screening: Normal Maternal Ultrasounds/Referrals: Normal Fetal Ultrasounds or other Referrals:  None Maternal Substance Abuse:  Yes:  Type: Smoker--quit during pregnancy Significant Maternal  Medications:  Meds include: Other: Valtrex Significant Maternal Lab Results:  Lab values include: Group B Strep positive and HSV carrier Other Comments:  None  ROS    Blood pressure 147/85, pulse 114, temperature 97.9 F (36.6 C), resp. rate 20, height 5\' 7"  (1.702 m), weight 122.471 kg (270 lb), last menstrual period 03/07/2013, SpO2 98.00%. Maternal Exam:  Uterine Assessment: Contraction strength is mild.  Contraction frequency is irregular.   Abdomen: Patient reports no abdominal tenderness. Fetal presentation: vertex  Introitus: Normal vulva. Normal vagina.  Amniotic fluid character: clear.     Physical Exam  Constitutional: She is oriented to person, place, and time. She appears well-developed and well-nourished.  Cardiovascular: Normal rate and regular rhythm.   Respiratory: Effort normal and breath sounds normal.  GI: Soft.  Genitourinary: Vagina normal.  Neurological: She is alert and oriented to person, place, and time.  Psychiatric: She has a normal mood and affect. Her behavior is normal.    Prenatal labs: ABO, Rh:  O positive Antibody:  Negative Rubella:  Immune RPR:   NR HBsAg:   Neg HIV:   Neg GBS:   Positive Tetra screen negative One hour GTT 73 Hgb AS  Assessment/Plan: The patient has been counseled on risks and benefits of VBAC vs repeat c-section.  We specifically discussed the risks of uterine rupture and  the fact that it can be a long process if cervix is unfavorable with a higher likelihood of failed induction. The patient has decided that she does not want to attempt VBAC with a cervix that is unfavorable.  We discussed the risks and benefits of c-section in detail including, bleeding, infection and possible damage to bowel and bladder.  The patient understands and desires to proceed.  The patient also states that she refuses all blood products.  In the event she was hemorrhaging and would die without blood, she would choose death.  She will allow  Hespan.  I pointed out to the patient that she stated she would take blood products at her prenatal visits, and she states that she does not remember that.  She has signed the form to refuse blood products.  Michele Becker,Michele Becker W 12/08/2013, 9:12 PM

## 2013-12-09 ENCOUNTER — Encounter (HOSPITAL_COMMUNITY): Payer: Self-pay

## 2013-12-09 ENCOUNTER — Encounter (HOSPITAL_COMMUNITY): Payer: Self-pay | Admitting: Anesthesiology

## 2013-12-09 DIAGNOSIS — Z98891 History of uterine scar from previous surgery: Secondary | ICD-10-CM

## 2013-12-09 LAB — NO BLOOD PRODUCTS

## 2013-12-09 LAB — CBC
HCT: 30.6 % — ABNORMAL LOW (ref 36.0–46.0)
Hemoglobin: 10.7 g/dL — ABNORMAL LOW (ref 12.0–15.0)
MCH: 27.4 pg (ref 26.0–34.0)
MCHC: 35 g/dL (ref 30.0–36.0)
MCV: 78.3 fL (ref 78.0–100.0)
PLATELETS: 128 10*3/uL — AB (ref 150–400)
RBC: 3.91 MIL/uL (ref 3.87–5.11)
RDW: 13.5 % (ref 11.5–15.5)
WBC: 10.3 10*3/uL (ref 4.0–10.5)

## 2013-12-09 LAB — RPR: RPR Ser Ql: NONREACTIVE

## 2013-12-09 LAB — ABO/RH: ABO/RH(D): O POS

## 2013-12-09 MED ORDER — ONDANSETRON HCL 4 MG/2ML IJ SOLN
INTRAMUSCULAR | Status: AC
  Filled 2013-12-09: qty 2

## 2013-12-09 MED ORDER — TETANUS-DIPHTH-ACELL PERTUSSIS 5-2.5-18.5 LF-MCG/0.5 IM SUSP: 0.5000 mL | Freq: Once | INTRAMUSCULAR | Status: DC

## 2013-12-09 MED ORDER — FENTANYL CITRATE 0.05 MG/ML IJ SOLN: 25.0000 ug | INTRAMUSCULAR | Status: DC | PRN

## 2013-12-09 MED ORDER — WITCH HAZEL-GLYCERIN EX PADS: 1.0000 "application " | MEDICATED_PAD | CUTANEOUS | Status: DC | PRN

## 2013-12-09 MED ORDER — ZOLPIDEM TARTRATE 5 MG PO TABS: 5.0000 mg | ORAL_TABLET | Freq: Every evening | ORAL | Status: DC | PRN

## 2013-12-09 MED ORDER — PROMETHAZINE HCL 25 MG/ML IJ SOLN: 6.2500 mg | Freq: Once | INTRAMUSCULAR | Status: DC

## 2013-12-09 MED ORDER — METOCLOPRAMIDE HCL 5 MG/ML IJ SOLN
10.0000 mg | Freq: Three times a day (TID) | INTRAMUSCULAR | Status: DC | PRN
  Administered 2013-12-09: 10 mg via INTRAVENOUS

## 2013-12-09 MED ORDER — NALBUPHINE HCL 10 MG/ML IJ SOLN: 5.0000 mg | INTRAMUSCULAR | Status: DC | PRN

## 2013-12-09 MED ORDER — DIPHENHYDRAMINE HCL 50 MG/ML IJ SOLN: 25.0000 mg | INTRAMUSCULAR | Status: DC | PRN

## 2013-12-09 MED ORDER — OXYCODONE-ACETAMINOPHEN 5-325 MG PO TABS
1.0000 | ORAL_TABLET | ORAL | Status: DC | PRN
  Administered 2013-12-09 – 2013-12-11 (×7): 1 via ORAL
  Administered 2013-12-11: 2 via ORAL
  Administered 2013-12-11: 1 via ORAL
  Filled 2013-12-09 (×5): qty 1
  Filled 2013-12-09: qty 2
  Filled 2013-12-09 (×4): qty 1

## 2013-12-09 MED ORDER — MEPERIDINE HCL 25 MG/ML IJ SOLN: 6.2500 mg | INTRAMUSCULAR | Status: DC | PRN

## 2013-12-09 MED ORDER — SIMETHICONE 80 MG PO CHEW
80.0000 mg | CHEWABLE_TABLET | Freq: Three times a day (TID) | ORAL | Status: DC
  Administered 2013-12-09 – 2013-12-10 (×6): 80 mg via ORAL
  Filled 2013-12-09 (×7): qty 1

## 2013-12-09 MED ORDER — IBUPROFEN 600 MG PO TABS
600.0000 mg | ORAL_TABLET | Freq: Four times a day (QID) | ORAL | Status: DC
  Administered 2013-12-09 – 2013-12-11 (×8): 600 mg via ORAL
  Filled 2013-12-09 (×9): qty 1

## 2013-12-09 MED ORDER — ONDANSETRON HCL 4 MG/2ML IJ SOLN: 4.0000 mg | Freq: Three times a day (TID) | INTRAMUSCULAR | Status: DC | PRN

## 2013-12-09 MED ORDER — SODIUM CHLORIDE 0.9 % IJ SOLN: 3.0000 mL | INTRAMUSCULAR | Status: DC | PRN

## 2013-12-09 MED ORDER — KETOROLAC TROMETHAMINE 30 MG/ML IJ SOLN
30.0000 mg | Freq: Four times a day (QID) | INTRAMUSCULAR | Status: AC | PRN
  Administered 2013-12-09: 30 mg via INTRAVENOUS

## 2013-12-09 MED ORDER — KETOROLAC TROMETHAMINE 30 MG/ML IJ SOLN
INTRAMUSCULAR | Status: AC
  Administered 2013-12-09: 30 mg via INTRAVENOUS
  Filled 2013-12-09: qty 1

## 2013-12-09 MED ORDER — DIPHENHYDRAMINE HCL 25 MG PO CAPS: 25.0000 mg | ORAL_CAPSULE | ORAL | Status: DC | PRN

## 2013-12-09 MED ORDER — ONDANSETRON HCL 4 MG PO TABS: 4.0000 mg | ORAL_TABLET | ORAL | Status: DC | PRN

## 2013-12-09 MED ORDER — LANOLIN HYDROUS EX OINT
1.0000 | TOPICAL_OINTMENT | CUTANEOUS | Status: DC | PRN
Start: 2013-12-09 — End: 2013-12-11

## 2013-12-09 MED ORDER — PROMETHAZINE HCL 25 MG/ML IJ SOLN
INTRAMUSCULAR | Status: AC
  Administered 2013-12-09: 6.25 mg
  Filled 2013-12-09: qty 1

## 2013-12-09 MED ORDER — NALOXONE HCL 1 MG/ML IJ SOLN
1.0000 ug/kg/h | INTRAVENOUS | Status: DC | PRN
  Filled 2013-12-09: qty 2

## 2013-12-09 MED ORDER — DIPHENHYDRAMINE HCL 50 MG/ML IJ SOLN: 12.5000 mg | INTRAMUSCULAR | Status: DC | PRN

## 2013-12-09 MED ORDER — METOCLOPRAMIDE HCL 5 MG/ML IJ SOLN
INTRAMUSCULAR | Status: AC
  Filled 2013-12-09: qty 2

## 2013-12-09 MED ORDER — DIPHENHYDRAMINE HCL 25 MG PO CAPS: 25.0000 mg | ORAL_CAPSULE | Freq: Four times a day (QID) | ORAL | Status: DC | PRN

## 2013-12-09 MED ORDER — NALOXONE HCL 0.4 MG/ML IJ SOLN: 0.4000 mg | INTRAMUSCULAR | Status: DC | PRN

## 2013-12-09 MED ORDER — DIBUCAINE 1 % RE OINT: 1.0000 "application " | TOPICAL_OINTMENT | RECTAL | Status: DC | PRN

## 2013-12-09 MED ORDER — SIMETHICONE 80 MG PO CHEW: 80.0000 mg | CHEWABLE_TABLET | ORAL | Status: DC | PRN

## 2013-12-09 MED ORDER — SENNOSIDES-DOCUSATE SODIUM 8.6-50 MG PO TABS
2.0000 | ORAL_TABLET | ORAL | Status: DC
  Administered 2013-12-10 – 2013-12-11 (×2): 2 via ORAL
  Filled 2013-12-09 (×2): qty 2

## 2013-12-09 MED ORDER — SCOPOLAMINE 1 MG/3DAYS TD PT72
MEDICATED_PATCH | TRANSDERMAL | Status: AC
  Filled 2013-12-09: qty 1

## 2013-12-09 MED ORDER — PRENATAL MULTIVITAMIN CH
1.0000 | ORAL_TABLET | Freq: Every day | ORAL | Status: DC
  Administered 2013-12-09 – 2013-12-10 (×2): 1 via ORAL
  Filled 2013-12-09 (×2): qty 1

## 2013-12-09 MED ORDER — KETOROLAC TROMETHAMINE 30 MG/ML IJ SOLN: 30.0000 mg | Freq: Four times a day (QID) | INTRAMUSCULAR | Status: AC | PRN

## 2013-12-09 MED ORDER — SIMETHICONE 80 MG PO CHEW
80.0000 mg | CHEWABLE_TABLET | ORAL | Status: DC
  Administered 2013-12-10 – 2013-12-11 (×2): 80 mg via ORAL
  Filled 2013-12-09 (×2): qty 1

## 2013-12-09 MED ORDER — ONDANSETRON HCL 4 MG/2ML IJ SOLN
4.0000 mg | INTRAMUSCULAR | Status: DC | PRN
  Administered 2013-12-09: 4 mg via INTRAVENOUS

## 2013-12-09 MED ORDER — MENTHOL 3 MG MT LOZG: 1.0000 | LOZENGE | OROMUCOSAL | Status: DC | PRN

## 2013-12-09 MED ORDER — LACTATED RINGERS IV SOLN
INTRAVENOUS | Status: DC
  Administered 2013-12-09: 04:00:00 via INTRAVENOUS

## 2013-12-09 MED ORDER — OXYTOCIN 40 UNITS IN LACTATED RINGERS INFUSION - SIMPLE MED: 62.5000 mL/h | INTRAVENOUS | Status: AC

## 2013-12-09 NOTE — Anesthesia Postprocedure Evaluation (Signed)
  Anesthesia Post-op Note  Patient: Michele Becker  Procedure(s) Performed: Procedure(s): CESAREAN SECTION (N/A)  Patient Location: PACU  Anesthesia Type:Spinal  Level of Consciousness: awake, alert  and oriented  Airway and Oxygen Therapy: Patient Spontanous Breathing  Post-op Pain: none  Post-op Assessment: Post-op Vital signs reviewed, Patient's Cardiovascular Status Stable, Respiratory Function Stable, Patent Airway, No signs of Nausea or vomiting, Pain level controlled, No headache and No backache  Post-op Vital Signs: Reviewed, stable and unstable  Complications: No apparent anesthesia complications

## 2013-12-09 NOTE — Progress Notes (Signed)
UR chart review completed.  

## 2013-12-09 NOTE — Anesthesia Postprocedure Evaluation (Signed)
Anesthesia Post Note  Patient: Michele Becker  Procedure(s) Performed: Procedure(s) (LRB): CESAREAN SECTION (N/A)  Anesthesia type: Spinal  Patient location: Mother/Baby  Post pain: Pain level controlled  Post assessment: Post-op Vital signs reviewed  Last Vitals:  Filed Vitals:   12/09/13 0703  BP: 123/62  Pulse: 130  Temp:   Resp:     Post vital signs: Reviewed  Level of consciousness: awake  Complications: No apparent anesthesia complications

## 2013-12-09 NOTE — Progress Notes (Signed)
Subjective: Postpartum Day 1: Cesarean Delivery Patient reports tolerating PO and no problems voiding.    Objective: Vital signs in last 24 hours: Temp:  [97.3 F (36.3 C)-98.6 F (37 C)] 98.6 F (37 C) (02/24 1055) Pulse Rate:  [80-130] 88 (02/24 1055) Resp:  [16-25] 18 (02/24 1055) BP: (116-152)/(61-88) 116/61 mmHg (02/24 1055) SpO2:  [96 %-100 %] 96 % (02/24 1055) Weight:  [122.471 kg (270 lb)] 122.471 kg (270 lb) (02/23 2041)  Physical Exam:  General: alert and cooperative Lochia: appropriate Uterine Fundus: firm Incision: C/D/I    Recent Labs  12/08/13 2150 12/09/13 0630  HGB 11.3* 10.7*  HCT 31.4* 30.6*    Assessment/Plan: Status post Cesarean section. Doing well postoperatively.  Continue current care.  Oliver PilaRICHARDSON,Drisana Schweickert W 12/09/2013, 11:31 AM

## 2013-12-09 NOTE — Transfer of Care (Signed)
Immediate Anesthesia Transfer of Care Note  Patient: Michele Becker  Procedure(s) Performed: Procedure(s): CESAREAN SECTION (N/A)  Patient Location: PACU  Anesthesia Type:Spinal  Level of Consciousness: awake, alert  and oriented  Airway & Oxygen Therapy: Patient Spontanous Breathing  Post-op Assessment: Report given to PACU RN  Post vital signs: Reviewed and stable  Complications: No apparent anesthesia complications

## 2013-12-09 NOTE — Brief Op Note (Signed)
12/08/2013 - 12/09/2013  12:23 AM  PATIENT:  Michele Becker  28 y.o. female  PRE-OPERATIVE DIAGNOSIS:  Previous Cesarean, Ruptured Membranes  POST-OPERATIVE DIAGNOSIS:  Previous Cesarean, Ruptured Membranes  PROCEDURE:  Procedure(s): CESAREAN SECTION (N/A)   Repeat Low Transverse C-section with two layer closure of uterus  SURGEON:  Surgeon(s) and Role:    * Oliver PilaKathy W Raynelle Fujikawa, MD - Primary  ANESTHESIA:   spinal  EBL:  Total I/O In: 2300 [I.V.:2300] Out: 550 [Urine:50; Blood:500]  BLOOD ADMINISTERED:none  DRAINS: Urinary Catheter (Foley)   LOCAL MEDICATIONS USED:  NONE  SPECIMEN:  Placenta  DISPOSITION OF SPECIMEN:  L&D  COUNTS:  YES  TOURNIQUET:  * No tourniquets in log *  DICTATION: .Dragon Dictation  PLAN OF CARE: Admit to inpatient   PATIENT DISPOSITION:  PACU - hemodynamically stable.

## 2013-12-09 NOTE — Progress Notes (Signed)
Report given to Helmut MusterAlicia, Charity fundraiserN.  Patient remains nauseous after Reglan 10 mg and an additional 4 mg of Zofran.  Dr. Malen GauzeFoster called orders given for phenegran 6.25 mg.  Patient free from nausoueness at time of transfer to floor.

## 2013-12-09 NOTE — Op Note (Signed)
Operative note  Preop diagnosis Term pregnancy at 39+ weeks Prior C-section declines VBAC Spontaneous rupture of membranes  Postop diagnosis Same  Procedure Repeat low transverse C-section with 2 layer closure of uterus  Surgeon Dr. Huel CoteKathy Adeel Becker  Anesthesia Spinal  Findings There was a viable female infant in the vertex presentation. Apgars were 8 and 9. Weight pending at time of dictation. Uterus and ovaries were normal. The left fallopian tube was slightly adherent to the round ligament and was freed at the time of C-section, the right fallopian tube was normal.  Fluids Estimated blood loss 500 cc Urine output 50 cc clear IV fluid 2200 cc LR  Specimen Placenta sent to L&D  Procedure note  Patient was taken to the operating room where epidural anesthesia was found to be adequate by Allis clamp test. She was prepped and draped in the normal sterile fashion in the dorsal supine position with a leftward tilt. An appropriate time out was performed. A Pfannenstiel skin incision was then made through a preexisting scar with the scalpel and carried through to the underlying layer of fascia by sharp dissection and Bovie cautery. The fascia was nicked in the midline and the incision was extended laterally with Mayo scissors. The inferior aspect of the incision was grasped Coker clamps and dissected off the underlying rectus muscles. In a similar fashion the superior aspect was dissected off the rectus muscles.  There was substantial scarring of the rectus muscle and the midline obscured.  Rectus muscles were separated in the approximate midline and the peritoneal cavity entered bluntly. The peritoneal incision was then extended both superiorly and inferiorly with careful attention to avoid both bowel and bladder. The Alexis self-retaining wound retractor was then placed within the incision and the lower uterine segment exposed. The bladder flap was developed with Metzenbaum scissors and  pushed away from the lower uterine segment. The lower uterine segment was then incised in a transverse fashion and the cavity itself entered bluntly. The incision was extended bluntly. The infant's head was then lifted and delivered from the incision without difficulty. The remainder of the infant delivered and the nose and mouth bulb suctioned with the cord clamped and cut as well. The infant was handed off to the waiting pediatricians. The placenta was then spontaneously expressed from the uterus and the uterus cleared of all clots and debris with moist lap sponge. The uterine incision was then repaired in 2 layers the first layer was a running locked layer 1-0 chromic and the second an imbricating layer of the same suture. The tubes and ovaries were inspected and the gutters cleared of all clots and debris.The left fallopian tube had some anterior adhesions to the round ligament and anterior surface of the uterus and these were taken down with the tube then being free.  The uterine incision was inspected and found to be hemostatic. All instruments and sponges as well as the Alexis retractor were then removed from the abdomen. The rectus muscles and peritoneum were then reapproximated with several interrupted mattress sutures of 2-0 Vicryl. The fascia was then closed with 0 Vicryl in a running fashion. Subcutaneous tissue was reapproximated with 3-0 plain in a running fashion. The skin was closed with a subcuticular stitch of 4-0 Vicryl on a Keith needle and then reinforced with benzoin and Steri-Strips. At the conclusion of the procedure all instruments and sponge counts were correct. Patient was taken to the recovery room in good condition with her baby accompanying her skin to skin.

## 2013-12-09 NOTE — Addendum Note (Signed)
Addendum created 12/09/13 [id number] by Algis GreenhouseLinda A Lisaann Atha, CRNA   Modules edited: Notes Section   Notes Section:  File: [id number]

## 2013-12-10 ENCOUNTER — Encounter (HOSPITAL_COMMUNITY): Payer: Self-pay | Admitting: Obstetrics and Gynecology

## 2013-12-10 LAB — BIRTH TISSUE RECOVERY COLLECTION (PLACENTA DONATION)

## 2013-12-10 NOTE — Lactation Note (Signed)
This note was copied from the chart of Boy Kaytie Clovis RileyMitchell. Lactation Consultation Note  Patient Name: Boy Angelina SheriffChasity Mitchell UJWJX'BToday's Date: 12/10/2013 Reason for consult: Follow-up assessment Mom had baby latched in laid back position when I arrived. Baby demonstrating a good rhythmic suck, few swallows noted. Mom denies questions or concerns. Cluster feeding reviewed. Engorgement care reviewed if needed. Advised of OP services and support group. Advised to call if would like assist.   Maternal Data    Feeding Feeding Type: Breast Fed  LATCH Score/Interventions Latch: Grasps breast easily, tongue down, lips flanged, rhythmical sucking.  Audible Swallowing: A few with stimulation  Type of Nipple: Everted at rest and after stimulation  Comfort (Breast/Nipple): Soft / non-tender     Hold (Positioning): No assistance needed to correctly position infant at breast.  LATCH Score: 9  Lactation Tools Discussed/Used     Consult Status Consult Status: PRN Date: 12/11/13 Follow-up type: In-patient    Alfred LevinsGranger, Hikaru Delorenzo Ann 12/10/2013, 2:46 PM

## 2013-12-10 NOTE — Progress Notes (Signed)
Subjective: Postpartum Day 1: Cesarean Delivery Patient reports tolerating PO.  Voiding  Objective: Vital signs in last 24 hours: Temp:  [97.4 F (36.3 C)-98.1 F (36.7 C)] 97.4 F (36.3 C) (02/25 0526) Pulse Rate:  [82-95] 94 (02/25 0526) Resp:  [18-20] 18 (02/25 0526) BP: (116-124)/(64-65) 122/65 mmHg (02/25 0526) SpO2:  [96 %-97 %] 97 % (02/24 2300)  Physical Exam:  General: alert and cooperative Lochia: appropriate Uterine Fundus: firm Incision: CDI   Recent Labs  12/08/13 2150 12/09/13 0630  HGB 11.3* 10.7*  HCT 31.4* 30.6*    Assessment/Plan: Status post Cesarean section. Doing well postoperatively.  Continue current care.  Michele Becker,Michele Becker W 12/10/2013, 11:14 AM

## 2013-12-11 MED ORDER — IBUPROFEN 800 MG PO TABS: 800.0000 mg | ORAL_TABLET | Freq: Three times a day (TID) | ORAL | Status: DC | PRN

## 2013-12-11 MED ORDER — OXYCODONE-ACETAMINOPHEN 5-325 MG PO TABS: 1.0000 | ORAL_TABLET | Freq: Four times a day (QID) | ORAL | Status: DC | PRN

## 2013-12-11 MED ORDER — PRENATAL MULTIVITAMIN CH: 1.0000 | ORAL_TABLET | Freq: Every day | ORAL | Status: DC

## 2013-12-11 NOTE — Discharge Summary (Signed)
Obstetric Discharge Summary Reason for Admission: cesarean section Prenatal Procedures: none Intrapartum Procedures: cesarean: low cervical, transverse Postpartum Procedures: none Complications-Operative and Postpartum: none Hemoglobin  Date Value Ref Range Status  12/09/2013 10.7* 12.0 - 15.0 g/dL Final     HCT  Date Value Ref Range Status  12/09/2013 30.6* 36.0 - 46.0 % Final    Physical Exam:  General: alert and no distress Lochia: appropriate Uterine Fundus: firm Incision: healing well DVT Evaluation: No evidence of DVT seen on physical exam.  Discharge Diagnoses: Term Pregnancy-delivered  Discharge Information: Date: 12/11/2013 Activity: pelvic rest Diet: routine Medications: PNV, Ibuprofen and Percocet Condition: stable Instructions: refer to practice specific booklet Discharge to: home Follow-up Information   Follow up with [name],KATHY W, MD. Schedule an appointment as soon as possible for a visit in 2 weeks. (Incision Check, Full PP in 6 wks)    Specialty:  Obstetrics and Gynecology   Contact information:   [address], [address] [place] [address] [phone]       Newborn Data: Live born female  Birth Weight: 7 lb 8.1 oz (3405 g) APGAR: 8, 9  Home with mother.  Michele Becker,Michele Becker 12/11/2013, 8:34 AM

## 2013-12-11 NOTE — Progress Notes (Addendum)
Subjective: Postpartum Day 3: Cesarean Delivery Patient reports incisional pain and tolerating PO.  No c/o's  Objective: Vital signs in last 24 hours: Temp:  [98 F (36.7 C)-98.6 F (37 C)] 98 F (36.7 C) (02/26 0600) Pulse Rate:  [75-96] 96 (02/26 0600) Resp:  [18] 18 (02/26 0600) BP: (119-120)/(74-75) 119/74 mmHg (02/26 0600)  Physical Exam:  General: alert and no distress Lochia: appropriate Uterine Fundus: firm Incision: healing well DVT Evaluation: No evidence of DVT seen on physical exam.   Recent Labs  12/08/13 2150 12/09/13 0630  HGB 11.3* 10.7*  HCT 31.4* 30.6*    Assessment/Plan: Status post Cesarean section. Doing well postoperatively.  Continue current care.   Pt desires d/c home.  D/C with motrin, percocet, and pnv.  F/U in two weeks.    BOVARD,Minh Roanhorse 12/11/2013, 8:06 AM

## 2013-12-19 ENCOUNTER — Other Ambulatory Visit (HOSPITAL_COMMUNITY): Payer: Medicaid Other

## 2013-12-22 ENCOUNTER — Inpatient Hospital Stay (HOSPITAL_COMMUNITY)
Admission: RE | Admit: 2013-12-22 | Payer: Medicaid Other | Source: Ambulatory Visit | Admitting: Obstetrics and Gynecology

## 2013-12-22 ENCOUNTER — Encounter (HOSPITAL_COMMUNITY): Admission: RE | Payer: Self-pay | Source: Ambulatory Visit

## 2013-12-22 SURGERY — Surgical Case
Anesthesia: Regional

## 2014-08-17 ENCOUNTER — Encounter (HOSPITAL_COMMUNITY): Payer: Self-pay | Admitting: Obstetrics and Gynecology

## 2015-11-28 ENCOUNTER — Emergency Department
Admission: EM | Admit: 2015-11-28 | Discharge: 2015-11-28 | Disposition: A | Discharge status: Home / Self Care | Payer: Medicaid Other | Attending: Emergency Medicine | Admitting: Emergency Medicine

## 2015-11-28 ENCOUNTER — Encounter: Payer: Self-pay | Admitting: Emergency Medicine

## 2015-11-28 DIAGNOSIS — Z79899 Other long term (current) drug therapy: Secondary | ICD-10-CM | POA: Insufficient documentation

## 2015-11-28 DIAGNOSIS — K529 Noninfective gastroenteritis and colitis, unspecified: Secondary | ICD-10-CM | POA: Diagnosis not present

## 2015-11-28 DIAGNOSIS — R112 Nausea with vomiting, unspecified: Secondary | ICD-10-CM | POA: Diagnosis present

## 2015-11-28 DIAGNOSIS — F17200 Nicotine dependence, unspecified, uncomplicated: Secondary | ICD-10-CM | POA: Insufficient documentation

## 2015-11-28 LAB — URINALYSIS COMPLETE WITH MICROSCOPIC (ARMC ONLY)
BILIRUBIN URINE: NEGATIVE
Glucose, UA: NEGATIVE mg/dL
HGB URINE DIPSTICK: NEGATIVE
LEUKOCYTES UA: NEGATIVE
Nitrite: NEGATIVE
PROTEIN: 30 mg/dL — AB
Specific Gravity, Urine: 1.03 (ref 1.005–1.030)
pH: 5 (ref 5.0–8.0)

## 2015-11-28 LAB — COMPREHENSIVE METABOLIC PANEL
ALT: 19 U/L (ref 14–54)
AST: 23 U/L (ref 15–41)
Albumin: 4.6 g/dL (ref 3.5–5.0)
Alkaline Phosphatase: 102 U/L (ref 38–126)
Anion gap: 12 (ref 5–15)
BUN: 11 mg/dL (ref 6–20)
CALCIUM: 9.7 mg/dL (ref 8.9–10.3)
CHLORIDE: 108 mmol/L (ref 101–111)
CO2: 22 mmol/L (ref 22–32)
Creatinine, Ser: 0.75 mg/dL (ref 0.44–1.00)
GFR calc non Af Amer: >60 mL/min (ref >60)
Glucose, Bld: 97 mg/dL (ref 65–99)
Potassium: 4.2 mmol/L (ref 3.5–5.1)
SODIUM: 142 mmol/L (ref 135–145)
Total Bilirubin: 1.4 mg/dL — ABNORMAL HIGH (ref 0.3–1.2)
Total Protein: 8.4 g/dL — ABNORMAL HIGH (ref 6.5–8.1)

## 2015-11-28 LAB — CBC
HCT: 41.7 % (ref 35.0–47.0)
Hemoglobin: 14 g/dL (ref 12.0–16.0)
MCH: 27.6 pg (ref 26.0–34.0)
MCHC: 33.5 g/dL (ref 32.0–36.0)
MCV: 82.6 fL (ref 80.0–100.0)
PLATELETS: 187 10*3/uL (ref 150–440)
RBC: 5.05 MIL/uL (ref 3.80–5.20)
RDW: 13.4 % (ref 11.5–14.5)
WBC: 7.7 10*3/uL (ref 3.6–11.0)

## 2015-11-28 LAB — POCT PREGNANCY, URINE: PREG TEST UR: NEGATIVE

## 2015-11-28 LAB — LIPASE, BLOOD: LIPASE: 18 U/L (ref 11–51)

## 2015-11-28 MED ORDER — ONDANSETRON 4 MG PO TBDP: 4.0000 mg | ORAL_TABLET | Freq: Three times a day (TID) | ORAL | Status: DC | PRN

## 2015-11-28 MED ORDER — ONDANSETRON 4 MG PO TBDP
4.0000 mg | ORAL_TABLET | Freq: Once | ORAL | Status: AC | PRN
  Administered 2015-11-28: 4 mg via ORAL

## 2015-11-28 MED ORDER — ONDANSETRON 4 MG PO TBDP
ORAL_TABLET | ORAL | Status: AC
  Filled 2015-11-28: qty 1

## 2015-11-28 MED ORDER — SODIUM CHLORIDE 0.9 % IV BOLUS (SEPSIS)
1000.0000 mL | Freq: Once | INTRAVENOUS | Status: AC
  Administered 2015-11-28: 1000 mL via INTRAVENOUS

## 2015-11-28 MED ORDER — ONDANSETRON HCL 4 MG/2ML IJ SOLN
4.0000 mg | Freq: Once | INTRAMUSCULAR | Status: AC
  Administered 2015-11-28: 4 mg via INTRAVENOUS
  Filled 2015-11-28: qty 2

## 2015-11-28 NOTE — ED Notes (Signed)
NAD noted at time of D/C. Pt denies questions or concerns. Pt ambulatory to the lobby at this time.  

## 2015-11-28 NOTE — ED Notes (Signed)
Patient reports lower abd pain, nausea and vomiting that started about 02:30 this am. Patient states that she has not been able to keep anything down.

## 2015-11-28 NOTE — ED Provider Notes (Addendum)
[facility] Emergency Department Provider Note  Time seen: 9:10 PM  I have reviewed the triage vital signs and the nursing notes.   HISTORY  Chief Complaint Abdominal Pain; Nausea; and Emesis    HPI Michele Becker is a 30 y.o. female with a past medical history of sickle cell trait, who presents the emergency department with nausea, vomiting, diarrhea. According to the patient at 2:30 this morning she was awoken with significant nausea and vomiting, followed shortly after by diffuse diarrhea. Patient states very watery diarrhea continues to feel nauseated unable to keep down any fluids today. Patient states diffuse abdominal cramping but denies focal abdominal pain. Denies fever. Describes her abdominal cramping is mild and diffuse, somewhat worse in the lower abdomen. Denies any vaginal bleeding or discharge. Denies any dysuria or hematuria.     Past Medical History  Diagnosis Date  . Peripheral edema   . Headache(784.0)     Migraines as child  . Sickle cell trait (HCC)   . Genital herpes     no outbreaks in years  . HSV (herpes simplex virus) anogenital infection   . Trichomonas vaginitis   . Bacterial vaginosis 07/22/2013    Patient Active Problem List   Diagnosis Date Noted  . S/P repeat low transverse C-section 12/09/2013    Past Surgical History  Procedure Laterality Date  . Wisdom tooth extraction    . Cesarean section      Pt states no fluid around baby  . Cesarean section N/A 12/08/2013    Procedure: CESAREAN SECTION;  Surgeon: Oliver Pila, MD;  Location: [facility];  Service: Obstetrics;  Laterality: N/A;    Current Outpatient Rx  Name  Route  Sig  Dispense  Refill  . acetaminophen (TYLENOL) 325 MG tablet   Oral   Take 650 mg by mouth every 6 (six) hours as needed for moderate pain.         . calcium carbonate (TUMS - DOSED IN MG ELEMENTAL CALCIUM) 500 MG chewable tablet   Oral   Chew 1 tablet by mouth daily as needed for  indigestion or heartburn.         . ferrous sulfate 325 (65 FE) MG tablet   Oral   Take 325 mg by mouth daily with breakfast.         . ibuprofen (ADVIL,MOTRIN) 800 MG tablet   Oral   Take 1 tablet (800 mg total) by mouth every 8 (eight) hours as needed.   45 tablet   1   . oxyCODONE-acetaminophen (PERCOCET/ROXICET) 5-325 MG per tablet   Oral   Take 1-2 tablets by mouth every 6 (six) hours as needed for severe pain (moderate - severe pain).   40 tablet   0   . Prenatal Vit-Fe Fumarate-FA (PRENATAL MULTIVITAMIN) TABS tablet   Oral   Take 1 tablet by mouth daily at 12 noon.   30 tablet   12     Allergies Review of patient's allergies indicates no known allergies.  Family History  Problem Relation Age of Onset  . Hypertension Other     Social History Social History  Substance Use Topics  . Smoking status: Current Some Day Smoker  . Smokeless tobacco: Never Used  . Alcohol Use: 16.8 oz/week    14 Cans of beer, 14 Standard drinks or equivalent per week    Review of Systems Constitutional: Negative for fever. Cardiovascular: Negative for chest pain. Respiratory: Negative for shortness of breath. Gastrointestinal: Diffuse  abdominal cramping. Positive for nausea, vomiting, diarrhea. Genitourinary: Negative for dysuria. Musculoskeletal: Negative for back pain. Neurological: Negative for headache 10-point ROS otherwise negative.  ____________________________________________   PHYSICAL EXAM:  VITAL SIGNS: ED Triage Vitals  Enc Vitals Group     BP 11/28/15 1910 130/82 mmHg     Pulse Rate 11/28/15 1910 94     Resp 11/28/15 1910 18     Temp 11/28/15 1910 98.8 F (37.1 C)     Temp Source 11/28/15 2039 Oral     SpO2 11/28/15 1910 100 %     Weight 11/28/15 1910 240 lb (108.863 kg)     Height 11/28/15 1910  (1.702 m)     Head Cir --      Peak Flow --      Pain Score 11/28/15 1911 9     Pain Loc --      Pain Edu? --      Excl. in GC? --      Constitutional: Alert and oriented. Well appearing and in no distress. Eyes: Normal exam ENT   Head: Normocephalic and atraumatic.   Mouth/Throat: Mucous membranes are moist. Cardiovascular: Normal rate, regular rhythm. No murmur Respiratory: Normal respiratory effort without tachypnea nor retractions. Breath sounds are clear  Gastrointestinal: Soft, no focal tenderness to palpation, no distention. Musculoskeletal: Nontender with normal range of motion in all extremities.  Neurologic:  Normal speech and language. No gross focal neurologic deficits  Skin:  Skin is warm, dry and intact.  Psychiatric: Mood and affect are normal. Speech and behavior are normal. ____________________________________________     INITIAL IMPRESSION / ASSESSMENT AND PLAN / ED COURSE  Pertinent labs & imaging results that were available during my care of the patient were reviewed by me and considered in my medical decision making (see chart for details).  Patient presents the emergency department with acute onset of nausea, vomiting, diarrhea and diffuse abdominal cramping. States he abdominal cramping is mild at this time. Labs are within normal limits, besides 2+ ketones and a urinalysis. Highly suspect a gastroenteritis. Given her nontender abdominal exam with a normal white blood cell count I did not feel that we need to proceed with a CT scan at this time given a  reassuring clinical picture most consistent with a viral gastroenteritis. However discussed return precautions per any worsening pain or fever. Patient agreeable plan.  ____________________________________________   FINAL CLINICAL IMPRESSION(S) / ED DIAGNOSES  Gastroenteritis   Minna Antis, MD 11/28/15 2112  Minna Antis, MD 11/28/15 2113

## 2015-11-28 NOTE — Discharge Instructions (Signed)
Please take a nausea medication as needed, as prescribed. Please drink plenty of fluids over the next several days, and obtain plenty of rest. As we discussed please use good handwashing technique, and a bleach solution for cleaning bathrooms.  If he developed worsening abdominal pain or fever please return to the emergency department for further evaluation, otherwise follow-up with a primary care physician in 2-3 days for recheck/reevaluation.   Viral Gastroenteritis  Viral gastroenteritis is also known as stomach flu. This condition affects the stomach and intestinal tract. It can cause sudden diarrhea and vomiting. The illness typically lasts 3 to 8 days. Most people develop an immune response that eventually gets rid of the virus. While this natural response develops, the virus can make you quite ill.  CAUSES  Many different viruses can cause gastroenteritis, such as rotavirus or noroviruses. You can catch one of these viruses by consuming contaminated food or water. You may also catch a virus by sharing utensils or other personal items with an infected person or by touching a contaminated surface.  SYMPTOMS  The most common symptoms are diarrhea and vomiting. These problems can cause a severe loss of body fluids (dehydration) and a body salt (electrolyte) imbalance. Other symptoms may include:  Fever.  Headache.  Fatigue.  Abdominal pain. DIAGNOSIS  Your caregiver can usually diagnose viral gastroenteritis based on your symptoms and a physical exam. A stool sample may also be taken to test for the presence of viruses or other infections.  TREATMENT  This illness typically goes away on its own. Treatments are aimed at rehydration. The most serious cases of viral gastroenteritis involve vomiting so severely that you are not able to keep fluids down. In these cases, fluids must be given through an intravenous line (IV).  HOME CARE INSTRUCTIONS  Drink enough fluids to keep your urine clear or pale  yellow. Drink small amounts of fluids frequently and increase the amounts as tolerated.  Ask your caregiver for specific rehydration instructions.  Avoid:  Foods high in sugar.  Alcohol.  Carbonated drinks.  Tobacco.  Juice.  Caffeine drinks.  Extremely hot or cold fluids.  Fatty, greasy foods.  Too much intake of anything at one time.  Dairy products until 24 to 48 hours after diarrhea stops. You may consume probiotics. Probiotics are active cultures of beneficial bacteria. They may lessen the amount and number of diarrheal stools in adults. Probiotics can be found in yogurt with active cultures and in supplements.  Wash your hands well to avoid spreading the virus.  Only take over-the-counter or prescription medicines for pain, discomfort, or fever as directed by your caregiver. Do not give aspirin to children. Antidiarrheal medicines are not recommended.  Ask your caregiver if you should continue to take your regular prescribed and over-the-counter medicines.  Keep all follow-up appointments as directed by your caregiver. SEEK IMMEDIATE MEDICAL CARE IF:  You are unable to keep fluids down.  You do not urinate at least once every 6 to 8 hours.  You develop shortness of breath.  You notice blood in your stool or vomit. This may look like coffee grounds.  You have abdominal pain that increases or is concentrated in one small area (localized).  You have persistent vomiting or diarrhea.  You have a fever.  The patient is a child younger than 3 months, and he or she has a fever.  The patient is a child older than 3 months, and he or she has a fever and persistent symptoms.  The patient is a child older than 3 months, and he or she has a fever and symptoms suddenly get worse.  The patient is a baby, and he or she has no tears when crying. MAKE SURE YOU:  Understand these instructions.  Will watch your condition.  Will get help right away if you are not doing well or get worse. This  information is not intended to replace advice given to you by your health care provider. Make sure you discuss any questions you have with your health care provider.  Document Released: 10/02/2005 Document Revised: 12/25/2011 Document Reviewed: 07/19/2011  Elsevier Interactive Patient Education [employer]

## 2016-04-05 ENCOUNTER — Ambulatory Visit (HOSPITAL_COMMUNITY)
Admission: EM | Admit: 2016-04-05 | Discharge: 2016-04-05 | Disposition: A | Discharge status: Home / Self Care | Payer: Medicaid Other | Attending: Emergency Medicine | Admitting: Emergency Medicine

## 2016-04-05 ENCOUNTER — Encounter (HOSPITAL_COMMUNITY): Payer: Self-pay | Admitting: *Deleted

## 2016-04-05 DIAGNOSIS — J069 Acute upper respiratory infection, unspecified: Secondary | ICD-10-CM

## 2016-04-05 MED ORDER — FLUTICASONE PROPIONATE 50 MCG/ACT NA SUSP
2.0000 | Freq: Every day | NASAL | Status: DC
Start: 2016-04-05 — End: 2016-09-17

## 2016-04-05 NOTE — ED Provider Notes (Signed)
CSN: [id number]     Arrival date & time 04/05/16  1818 History   First MD Initiated Contact with Patient 04/05/16 1839     Chief Complaint  Patient presents with  . URI   (Consider location/radiation/quality/duration/timing/severity/associated sxs/prior Treatment) HPI History obtained from patient: Location: Abdomen, upper resp  Context/Duration: Onset of URI symptoms for 2 days  Severity: 3  Quality: Timing:        Cold symptoms constant, diarrhea every couple of hours    Home Treatment: fluids, rest Associated symptoms:  diarrhea Family History: HTN    Past Medical History  Diagnosis Date  . Peripheral edema   . Headache(784.0)     Migraines as child  . Sickle cell trait (HCC)   . Genital herpes     no outbreaks in years  . HSV (herpes simplex virus) anogenital infection   . Trichomonas vaginitis   . Bacterial vaginosis 07/22/2013   Past Surgical History  Procedure Laterality Date  . Wisdom tooth extraction    . Cesarean section      Pt states no fluid around baby  . Cesarean section N/A 12/08/2013    Procedure: CESAREAN SECTION;  Surgeon: Oliver Pila, MD;  Location: [facility];  Service: Obstetrics;  Laterality: N/A;   Family History  Problem Relation Age of Onset  . Hypertension Other    Social History  Substance Use Topics  . Smoking status: Current Some Day Smoker  . Smokeless tobacco: Never Used  . Alcohol Use: 16.8 oz/week    14 Cans of beer, 14 Standard drinks or equivalent per week   OB History    Gravida Para Term Preterm AB TAB SAB Ectopic Multiple Living   Review of Systems  Denies: HEADACHE,   ABDOMINAL PAIN, CHEST PAIN, CONGESTION, DYSURIA, SHORTNESS OF BREATH  Allergies  Review of patient's allergies indicates no known allergies.  Home Medications   Prior to Admission medications   Medication Sig Start Date End Date Taking? Authorizing Provider  acetaminophen (TYLENOL) 325 MG tablet Take 650 mg by mouth every 6  (six) hours as needed for moderate pain.    Historical Provider, MD  calcium carbonate (TUMS - DOSED IN MG ELEMENTAL CALCIUM) 500 MG chewable tablet Chew 1 tablet by mouth daily as needed for indigestion or heartburn.    Historical Provider, MD  ferrous sulfate 325 (65 FE) MG tablet Take 325 mg by mouth daily with breakfast.    Historical Provider, MD  fluticasone (FLONASE) 50 MCG/ACT nasal spray Place 2 sprays into both nostrils daily. 04/05/16   Tharon Aquas, PA  ibuprofen (ADVIL,MOTRIN) 800 MG tablet Take 1 tablet (800 mg total) by mouth every 8 (eight) hours as needed. 12/11/13   Sherian Rein, MD  ondansetron (ZOFRAN ODT) 4 MG disintegrating tablet Take 1 tablet (4 mg total) by mouth every 8 (eight) hours as needed for nausea or vomiting. 11/28/15   Minna Antis, MD  oxyCODONE-acetaminophen (PERCOCET/ROXICET) 5-325 MG per tablet Take 1-2 tablets by mouth every 6 (six) hours as needed for severe pain (moderate - severe pain). 12/11/13   Sherian Rein, MD  Prenatal Vit-Fe Fumarate-FA (PRENATAL MULTIVITAMIN) TABS tablet Take 1 tablet by mouth daily at 12 noon. 12/11/13   Sherian Rein, MD   Meds Ordered and Administered this Visit  Medications - No data to display  BP 150/88 mmHg  Pulse 88  Temp(Src) 99.3 F (37.4 C) (Oral)  Resp  18  SpO2 99% No data found.   Physical Exam NURSES NOTES AND VITAL SIGNS REVIEWED. CONSTITUTIONAL: Well developed, well nourished, no acute distress HEENT: normocephalic, atraumatic EYES: Conjunctiva normal NECK:normal ROM, supple, no adenopathy PULMONARY:No respiratory distress, normal effort ABDOMINAL: Soft, ND, NT BS+, No CVAT MUSCULOSKELETAL: Normal ROM of all extremities,  SKIN: warm and dry without rash PSYCHIATRIC: Mood and affect, behavior are normal  ED Course  Procedures (including critical care time)  Labs Review Labs Reviewed - No data to display  Imaging Review No results found.   Visual Acuity Review  Right  Eye Distance:   Left Eye Distance:   Bilateral Distance:    Right Eye Near:   Left Eye Near:    Bilateral Near:      Viral illness, without abnormal vital signs, no fever, or dehydration. Expect full recovery. Return to work note provided.  Advised probiotics  No antibx required.    MDM   1. Acute URI     Patient is reassured that there are no issues that require transfer to higher level of care at this time or additional tests. Patient is advised to continue home symptomatic treatment. Patient is advised that if there are new or worsening symptoms to attend the emergency department, contact primary care provider, or return to UC. Instructions of care provided discharged home in stable condition.    THIS NOTE WAS GENERATED USING A VOICE RECOGNITION SOFTWARE PROGRAM. ALL REASONABLE EFFORTS  WERE MADE TO PROOFREAD THIS DOCUMENT FOR ACCURACY.  I have verbally reviewed the discharge instructions with the patient. A printed AVS was given to the patient.  All questions were answered prior to discharge.      Tharon AquasFrank C Patrick, PA 04/05/16 1956

## 2016-04-05 NOTE — ED Notes (Signed)
Pt  Reports  Symptoms  Of  Body  Aches   Chills       Fatigue     Congested        Nausea          With  Symptoms  X  sev  Days    pt  Is  Masked  And  Is  In a  Private  Room    She  Is    Sitting  Upright on the  Exam table     Speaking in  Complete  sentances  In no  Acute  Distress

## 2016-04-05 NOTE — Discharge Instructions (Signed)
Upper Respiratory Infection, Adult °Most upper respiratory infections (URIs) are a viral infection of the air passages leading to the lungs. A URI affects the nose, throat, and upper air passages. The most common type of URI is nasopharyngitis and is typically referred to as "the common cold." °URIs run their course and usually go away on their own. Most of the time, a URI does not require medical attention, but sometimes a bacterial infection in the upper airways can follow a viral infection. This is called a secondary infection. Sinus and middle ear infections are common types of secondary upper respiratory infections. °Bacterial pneumonia can also complicate a URI. A URI can worsen asthma and chronic obstructive pulmonary disease (COPD). Sometimes, these complications can require emergency medical care and may be life threatening.  °CAUSES °Almost all URIs are caused by viruses. A virus is a type of germ and can spread from one person to another.  °RISKS FACTORS °You may be at risk for a URI if:  °· You smoke.   °· You have chronic heart or lung disease. °· You have a weakened defense (immune) system.   °· You are very young or very old.   °· You have nasal allergies or asthma. °· You work in crowded or poorly ventilated areas. °· You work in health care facilities or schools. °SIGNS AND SYMPTOMS  °Symptoms typically develop 2-3 days after you come in contact with a cold virus. Most viral URIs last 7-10 days. However, viral URIs from the influenza virus (flu virus) can last 14-18 days and are typically more severe. Symptoms may include:  °· Runny or stuffy (congested) nose.   °· Sneezing.   °· Cough.   °· Sore throat.   °· Headache.   °· Fatigue.   °· Fever.   °· Loss of appetite.   °· Pain in your forehead, behind your eyes, and over your cheekbones (sinus pain). °· Muscle aches.   °DIAGNOSIS  °Your health care provider may diagnose a URI by: °· Physical exam. °· Tests to check that your symptoms are not due to  another condition such as: °· Strep throat. °· Sinusitis. °· Pneumonia. °· Asthma. °TREATMENT  °A URI goes away on its own with time. It cannot be cured with medicines, but medicines may be prescribed or recommended to relieve symptoms. Medicines may help: °· Reduce your fever. °· Reduce your cough. °· Relieve nasal congestion. °HOME CARE INSTRUCTIONS  °· Take medicines only as directed by your health care provider.   °· Gargle warm saltwater or take cough drops to comfort your throat as directed by your health care provider. °· Use a warm mist humidifier or inhale steam from a shower to increase air moisture. This may make it easier to breathe. °· Drink enough fluid to keep your urine clear or pale yellow.   °· Eat soups and other clear broths and maintain good nutrition.   °· Rest as needed.   °· Return to work when your temperature has returned to normal or as your health care provider advises. You may need to stay home longer to avoid infecting others. You can also use a face mask and careful hand washing to prevent spread of the virus. °· Increase the usage of your inhaler if you have asthma.   °· Do not use any tobacco products, including cigarettes, chewing tobacco, or electronic cigarettes. If you need help quitting, ask your health care provider. °PREVENTION  °The best way to protect yourself from getting a cold is to practice good hygiene.  °· Avoid oral or hand contact with people with cold   symptoms.   Wash your hands often if contact occurs.  There is no clear evidence that vitamin C, vitamin E, echinacea, or exercise reduces the chance of developing a cold. However, it is always recommended to get plenty of rest, exercise, and practice good nutrition.  SEEK MEDICAL CARE IF:   You are getting worse rather than better.   Your symptoms are not controlled by medicine.   You have chills.  You have worsening shortness of breath.  You have brown or red mucus.  You have yellow or brown nasal  discharge.  You have pain in your face, especially when you bend forward.  You have a fever.  You have swollen neck glands.  You have pain while swallowing.  You have white areas in the back of your throat. SEEK IMMEDIATE MEDICAL CARE IF:   You have severe or persistent:  Headache.  Ear pain.  Sinus pain.  Chest pain.  You have chronic lung disease and any of the following:  Wheezing.  Prolonged cough.  Coughing up blood.  A change in your usual mucus.  You have a stiff neck.  You have changes in your:  Vision.  Hearing.  Thinking.  Mood. MAKE SURE YOU:   Understand these instructions.  Will watch your condition.  Will get help right away if you are not doing well or get worse.   This information is not intended to replace advice given to you by your health care provider. Make sure you discuss any questions you have with your health care provider.   Document Released: 03/28/2001 Document Revised: 02/16/2015 Document Reviewed: 01/07/2014 Elsevier Interactive Patient Education 2016 [employer] Diarrhea Diarrhea is watery poop (stool). It can make you feel weak, tired, thirsty, or give you a dry mouth (signs of dehydration). Watery poop is a sign of another problem, most often an infection. It often lasts 2-3 days. It can last longer if it is a sign of something serious. Take care of yourself as told by your doctor. HOME CARE   Drink 1 cup (8 ounces) of fluid each time you have watery poop.  Do not drink the following fluids:  Those that contain simple sugars (fructose, glucose, galactose, lactose, sucrose, maltose).  Sports drinks.  Fruit juices.  Whole milk products.  Sodas.  Drinks with caffeine (coffee, tea, soda) or alcohol.  Oral rehydration solution may be used if the doctor says it is okay. You may make your own solution. Follow this recipe:   - teaspoon table salt.   teaspoon baking soda.   teaspoon salt substitute containing  potassium chloride.  1 tablespoons sugar.  1 liter (34 ounces) of water.  Avoid the following foods:  High fiber foods, such as raw fruits and vegetables.  Nuts, seeds, and whole grain breads and cereals.   Those that are sweetened with sugar alcohols (xylitol, sorbitol, mannitol).  Try eating the following foods:  Starchy foods, such as rice, toast, pasta, low-sugar cereal, oatmeal, baked potatoes, crackers, and bagels.  Bananas.  Applesauce.  Eat probiotic-rich foods, such as yogurt and milk products that are fermented.  Wash your hands well after each time you have watery poop.  Only take medicine as told by your doctor.  Take a warm bath to help lessen burning or pain from having watery poop. GET HELP RIGHT AWAY IF:   You cannot drink fluids without throwing up (vomiting).  You keep throwing up.  You have blood in your poop, or your poop looks black and  tarry.  You do not pee (urinate) in 6-8 hours, or there is only a small amount of very dark pee.  You have belly (abdominal) pain that gets worse or stays in the same spot (localizes).  You are weak, dizzy, confused, or light-headed.  You have a very bad headache.  Your watery poop gets worse or does not get better.  You have a fever or lasting symptoms for more than 2-3 days.  You have a fever and your symptoms suddenly get worse. MAKE SURE YOU:   Understand these instructions.  Will watch your condition.  Will get help right away if you are not doing well or get worse.   This information is not intended to replace advice given to you by your health care provider. Make sure you discuss any questions you have with your health care provider.   Document Released: 03/20/2008 Document Revised: 10/23/2014 Document Reviewed: 06/09/2012 Elsevier Interactive Patient Education [employer]

## 2016-09-17 ENCOUNTER — Ambulatory Visit (HOSPITAL_COMMUNITY)
Admission: EM | Admit: 2016-09-17 | Discharge: 2016-09-17 | Disposition: A | Discharge status: Home / Self Care | Payer: Medicaid Other | Attending: Emergency Medicine | Admitting: Emergency Medicine

## 2016-09-17 ENCOUNTER — Encounter (HOSPITAL_COMMUNITY): Payer: Self-pay | Admitting: Emergency Medicine

## 2016-09-17 DIAGNOSIS — Z79899 Other long term (current) drug therapy: Secondary | ICD-10-CM | POA: Insufficient documentation

## 2016-09-17 DIAGNOSIS — H66001 Acute suppurative otitis media without spontaneous rupture of ear drum, right ear: Secondary | ICD-10-CM | POA: Diagnosis not present

## 2016-09-17 DIAGNOSIS — F17200 Nicotine dependence, unspecified, uncomplicated: Secondary | ICD-10-CM | POA: Insufficient documentation

## 2016-09-17 DIAGNOSIS — J029 Acute pharyngitis, unspecified: Secondary | ICD-10-CM | POA: Diagnosis not present

## 2016-09-17 LAB — POCT RAPID STREP A: Streptococcus, Group A Screen (Direct): NEGATIVE

## 2016-09-17 MED ORDER — GUAIFENESIN-CODEINE 100-10 MG/5ML PO SOLN: 10.0000 mL | Freq: Four times a day (QID) | ORAL | 0 refills | Status: DC | PRN

## 2016-09-17 MED ORDER — AMOXICILLIN-POT CLAVULANATE 875-125 MG PO TABS: 1.0000 | ORAL_TABLET | Freq: Two times a day (BID) | ORAL | 0 refills | Status: AC

## 2016-09-17 NOTE — ED Provider Notes (Signed)
CSN: [id number]     Arrival date & time 09/17/16  1718 History   First MD Initiated Contact with Patient 09/17/16 1909     Chief Complaint  Patient presents with  . Sore Throat   (Consider location/radiation/quality/duration/timing/severity/associated sxs/prior Treatment) 30 year old female presents with sore throat, bilateral ear pain, chills, cough and nasal congestion for the past 2 weeks. Denies any fever or GI symptoms. Has taken Alka Seltzer Plus with no relief.  Unable to sleep due to cough. Takes no daily medication.    The history is provided by the patient.    Past Medical History:  Diagnosis Date  . Bacterial vaginosis 07/22/2013  . Genital herpes    no outbreaks in years  . Headache(784.0)    Migraines as child  . HSV (herpes simplex virus) anogenital infection   . Peripheral edema   . Sickle cell trait (HCC)   . Trichomonas vaginitis    Past Surgical History:  Procedure Laterality Date  . CESAREAN SECTION     Pt states no fluid around baby  . CESAREAN SECTION N/A 12/08/2013   Procedure: CESAREAN SECTION;  Surgeon: Oliver PilaKathy W Richardson, MD;  Location: [facility];  Service: Obstetrics;  Laterality: N/A;  . WISDOM TOOTH EXTRACTION     Family History  Problem Relation Age of Onset  . Hypertension Other    Social History  Substance Use Topics  . Smoking status: Current Some Day Smoker  . Smokeless tobacco: Never Used  . Alcohol use 16.8 oz/week    14 Cans of beer, 14 Standard drinks or equivalent per week   OB History    Gravida Para Term Preterm AB Living   3 2 2   1 2    SAB TAB Ectopic Multiple Live Births   1       1     Review of Systems  Constitutional: Positive for chills and fatigue. Negative for appetite change and fever.  HENT: Positive for congestion, ear pain, rhinorrhea, sinus pain, sinus pressure and sore throat.   Eyes: Negative for discharge.  Respiratory: Positive for cough. Negative for chest tightness, shortness of breath and wheezing.    Cardiovascular: Negative for chest pain.  Gastrointestinal: Negative for abdominal pain, diarrhea, nausea and vomiting.  Musculoskeletal: Negative for back pain and neck pain.  Skin: Negative for rash.  Neurological: Positive for headaches. Negative for dizziness, weakness and light-headedness.  Hematological: Negative for adenopathy.    Allergies  Patient has no known allergies.  Home Medications   Prior to Admission medications   Medication Sig Start Date End Date Taking? Authorizing Provider  amoxicillin-clavulanate (AUGMENTIN) 875-125 MG tablet Take 1 tablet by mouth every 12 (twelve) hours. 09/17/16 09/24/16  Sudie GrumblingAnn Berry Chianti Goh, NP  guaiFENesin-codeine 100-10 MG/5ML syrup Take 10 mLs by mouth every 6 (six) hours as needed for cough. 09/17/16   Sudie GrumblingAnn Berry Ally Knodel, NP   Meds Ordered and Administered this Visit  Medications - No data to display  BP 125/78 (BP Location: Left Arm)   Pulse 77   Temp 98.6 F (37 C) (Oral)   Resp 18   LMP 09/17/2016   SpO2 100%   Breastfeeding? No  No data found.   Physical Exam  Constitutional: She is oriented to person, place, and time. She appears well-developed and well-nourished. She appears ill. She appears distressed.  Patient is crying due to throat pain and feeling "ill"  HENT:  Head: Normocephalic and atraumatic.  Right Ear: Hearing, external ear and ear canal  normal. Tympanic membrane is injected, erythematous and bulging. A middle ear effusion is present.  Left Ear: Hearing, external ear and ear canal normal. Tympanic membrane is bulging.  Nose: Mucosal edema and rhinorrhea present. Right sinus exhibits frontal sinus tenderness. Right sinus exhibits no maxillary sinus tenderness. Left sinus exhibits frontal sinus tenderness. Left sinus exhibits no maxillary sinus tenderness.  Mouth/Throat: Uvula is midline and mucous membranes are normal. Posterior oropharyngeal erythema present. No posterior oropharyngeal edema. No tonsillar exudate.   Neck: Normal range of motion. Neck supple.  Cardiovascular: Normal rate, regular rhythm and normal heart sounds.   Pulmonary/Chest: Effort normal and breath sounds normal. No respiratory distress. She has no wheezes. She has no rhonchi. She has no rales.  Lymphadenopathy:    She has cervical adenopathy.       Right cervical: Superficial cervical and deep cervical adenopathy present.       Left cervical: No superficial cervical and no deep cervical adenopathy present.  Neurological: She is alert and oriented to person, place, and time.  Skin: Skin is warm and dry. Capillary refill takes less than 2 seconds.  Psychiatric: Her speech is normal. Thought content normal. Her affect is labile. She is withdrawn.    Urgent Care Course   Clinical Course     Procedures (including critical care time)  Labs Review Labs Reviewed  CULTURE, GROUP A STREP [facility])  POCT RAPID STREP A    Imaging Review No results found.   Visual Acuity Review  Right Eye Distance:   Left Eye Distance:   Bilateral Distance:    Right Eye Near:   Left Eye Near:    Bilateral Near:         MDM   1. Pharyngitis, unspecified etiology   2. Acute suppurative otitis media of right ear without spontaneous rupture of tympanic membrane, recurrence not specified    Reviewed negative rapid strep test with patient. Due to ear infection, recommend start Augmentin 875mg  twice a day as directed. Recommend Ibuprofen 600mg  every 8 hours as needed for pain. Take Guiatuss AC cough medication at night to help with cough and sleep.Reviewed [place] Controlled Substance Registry. No active Rx within 90 days. Encouraged to increase fluid intake to help loosen mucus. Follow-up in 3 days if not improving or go to ER if symptoms worsen.     Sudie GrumblingAnn Berry Patrena Santalucia, NP 09/18/16 [phone]

## 2016-09-17 NOTE — ED Triage Notes (Signed)
Here for ST onset 2 weeks associated w/dysphagia, bilateral ear fullness, chills, prod cough  Denies fevers  A&O x4.... NAD

## 2016-09-17 NOTE — Discharge Instructions (Signed)
Recommend start Augmentin twice a day as directed. Take Ibuprofen 600mg  every 8 hours as needed for pain. May take Cough medication at night as needed to help with cough and to help sleep. Encouraged to increase fluid intake to help loosen mucus. Follow-up in 3 days if not improving.

## 2016-09-20 LAB — CULTURE, GROUP A STREP (THRC)

## 2016-12-26 ENCOUNTER — Emergency Department (HOSPITAL_COMMUNITY): Payer: No Typology Code available for payment source

## 2016-12-26 ENCOUNTER — Encounter (HOSPITAL_COMMUNITY): Payer: Self-pay | Admitting: Emergency Medicine

## 2016-12-26 ENCOUNTER — Emergency Department (HOSPITAL_COMMUNITY)
Admission: EM | Admit: 2016-12-26 | Discharge: 2016-12-26 | Disposition: A | Discharge status: Home / Self Care | Payer: No Typology Code available for payment source | Attending: Emergency Medicine | Admitting: Emergency Medicine

## 2016-12-26 DIAGNOSIS — S0003XA Contusion of scalp, initial encounter: Secondary | ICD-10-CM | POA: Insufficient documentation

## 2016-12-26 DIAGNOSIS — S0990XA Unspecified injury of head, initial encounter: Secondary | ICD-10-CM | POA: Diagnosis present

## 2016-12-26 DIAGNOSIS — S93491A Sprain of other ligament of right ankle, initial encounter: Secondary | ICD-10-CM

## 2016-12-26 DIAGNOSIS — Y92410 Unspecified street and highway as the place of occurrence of the external cause: Secondary | ICD-10-CM | POA: Diagnosis not present

## 2016-12-26 DIAGNOSIS — F17200 Nicotine dependence, unspecified, uncomplicated: Secondary | ICD-10-CM | POA: Insufficient documentation

## 2016-12-26 DIAGNOSIS — S93431A Sprain of tibiofibular ligament of right ankle, initial encounter: Secondary | ICD-10-CM | POA: Diagnosis not present

## 2016-12-26 DIAGNOSIS — Y999 Unspecified external cause status: Secondary | ICD-10-CM | POA: Diagnosis not present

## 2016-12-26 DIAGNOSIS — S060X0A Concussion without loss of consciousness, initial encounter: Secondary | ICD-10-CM | POA: Diagnosis not present

## 2016-12-26 DIAGNOSIS — Y9389 Activity, other specified: Secondary | ICD-10-CM | POA: Insufficient documentation

## 2016-12-26 MED ORDER — OXYCODONE-ACETAMINOPHEN 5-325 MG PO TABS
ORAL_TABLET | ORAL | Status: AC
  Filled 2016-12-26: qty 1

## 2016-12-26 MED ORDER — MORPHINE SULFATE (PF) 4 MG/ML IV SOLN
4.0000 mg | Freq: Once | INTRAVENOUS | Status: AC
  Administered 2016-12-26: 4 mg via INTRAMUSCULAR
  Filled 2016-12-26: qty 1

## 2016-12-26 MED ORDER — OXYCODONE-ACETAMINOPHEN 5-325 MG PO TABS
1.0000 | ORAL_TABLET | ORAL | Status: DC | PRN
  Administered 2016-12-26: 1 via ORAL

## 2016-12-26 MED ORDER — HYDROCODONE-ACETAMINOPHEN 5-325 MG PO TABS: 1.0000 | ORAL_TABLET | Freq: Four times a day (QID) | ORAL | 0 refills | Status: DC | PRN

## 2016-12-26 MED ORDER — HYDROCODONE-ACETAMINOPHEN 5-325 MG PO TABS: 1.0000 | ORAL_TABLET | Freq: Once | ORAL | Status: DC

## 2016-12-26 MED ORDER — HYDROCODONE-ACETAMINOPHEN 5-325 MG PO TABS
1.0000 | ORAL_TABLET | Freq: Once | ORAL | Status: AC
  Administered 2016-12-26: 1 via ORAL
  Filled 2016-12-26: qty 1

## 2016-12-26 MED ORDER — IBUPROFEN 600 MG PO TABS: 600.0000 mg | ORAL_TABLET | Freq: Three times a day (TID) | ORAL | 0 refills | Status: DC | PRN
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
## 2016-12-26 NOTE — ED Notes (Signed)
Pt returned from xray

## 2016-12-26 NOTE — ED Notes (Signed)
Patient transported to X-ray 
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
## 2016-12-26 NOTE — ED Notes (Signed)
Pt c/o pain to head, braids missing, (denies LOC), also pain to entire left side; pt tearful in triage

## 2017-09-04 LAB — OB RESULTS CONSOLE HEPATITIS B SURFACE ANTIGEN: HEP B S AG: NEGATIVE

## 2017-09-04 LAB — OB RESULTS CONSOLE RUBELLA ANTIBODY, IGM: Rubella: IMMUNE

## 2017-09-04 LAB — OB RESULTS CONSOLE ANTIBODY SCREEN: Antibody Screen: NEGATIVE

## 2017-09-04 LAB — OB RESULTS CONSOLE GC/CHLAMYDIA
CHLAMYDIA, DNA PROBE: NEGATIVE
GC PROBE AMP, GENITAL: NEGATIVE

## 2017-09-04 LAB — OB RESULTS CONSOLE RPR: RPR: NONREACTIVE

## 2017-09-04 LAB — OB RESULTS CONSOLE ABO/RH: RH TYPE: POSITIVE

## 2017-09-04 LAB — OB RESULTS CONSOLE HIV ANTIBODY (ROUTINE TESTING): HIV: NONREACTIVE

## 2017-11-09 ENCOUNTER — Other Ambulatory Visit (HOSPITAL_COMMUNITY): Payer: Self-pay | Admitting: Obstetrics and Gynecology

## 2017-11-09 DIAGNOSIS — Z3A22 22 weeks gestation of pregnancy: Secondary | ICD-10-CM

## 2017-11-09 DIAGNOSIS — Z3689 Encounter for other specified antenatal screening: Secondary | ICD-10-CM

## 2017-11-09 DIAGNOSIS — O99212 Obesity complicating pregnancy, second trimester: Secondary | ICD-10-CM

## 2017-11-13 ENCOUNTER — Encounter (HOSPITAL_COMMUNITY): Payer: Self-pay | Admitting: *Deleted

## 2017-11-15 ENCOUNTER — Other Ambulatory Visit (HOSPITAL_COMMUNITY): Payer: Self-pay | Admitting: Obstetrics and Gynecology

## 2017-11-15 ENCOUNTER — Encounter (HOSPITAL_COMMUNITY): Payer: Self-pay

## 2017-11-15 ENCOUNTER — Ambulatory Visit (HOSPITAL_COMMUNITY)
Admission: RE | Admit: 2017-11-15 | Discharge: 2017-11-15 | Disposition: A | Discharge status: Home / Self Care | Payer: BLUE CROSS/BLUE SHIELD | Source: Ambulatory Visit | Attending: Obstetrics and Gynecology | Admitting: Obstetrics and Gynecology

## 2017-11-15 DIAGNOSIS — O34219 Maternal care for unspecified type scar from previous cesarean delivery: Secondary | ICD-10-CM | POA: Diagnosis not present

## 2017-11-15 DIAGNOSIS — O99212 Obesity complicating pregnancy, second trimester: Secondary | ICD-10-CM | POA: Diagnosis not present

## 2017-11-15 DIAGNOSIS — Z3A22 22 weeks gestation of pregnancy: Secondary | ICD-10-CM | POA: Insufficient documentation

## 2017-11-15 DIAGNOSIS — D573 Sickle-cell trait: Secondary | ICD-10-CM | POA: Insufficient documentation

## 2017-11-15 DIAGNOSIS — Z3689 Encounter for other specified antenatal screening: Secondary | ICD-10-CM

## 2017-11-15 DIAGNOSIS — O99012 Anemia complicating pregnancy, second trimester: Secondary | ICD-10-CM | POA: Insufficient documentation

## 2017-11-15 DIAGNOSIS — Z862 Personal history of diseases of the blood and blood-forming organs and certain disorders involving the immune mechanism: Secondary | ICD-10-CM

## 2017-11-15 DIAGNOSIS — E669 Obesity, unspecified: Secondary | ICD-10-CM | POA: Insufficient documentation

## 2018-01-18 ENCOUNTER — Other Ambulatory Visit: Payer: Self-pay | Admitting: Obstetrics and Gynecology

## 2018-02-14 LAB — OB RESULTS CONSOLE GBS: GBS: POSITIVE

## 2018-02-21 ENCOUNTER — Encounter (HOSPITAL_COMMUNITY): Payer: Self-pay

## 2018-03-06 NOTE — Patient Instructions (Signed)
Michele Becker  03/06/2018   Your procedure is scheduled on:  03/08/2018  Enter through the Main Entrance of [facility] at 0530 AM.  Pick up the phone at the desk and dial [phone]  Call this number if you have problems the morning of [phone]  Remember:   Do not eat food:(After Midnight) Desps de medianoche.  Do not drink clear liquids: (After Midnight) Desps de medianoche.  Take these medicines the morning of surgery with A SIP OF WATER: valtrex   Do not wear jewelry, make-up or nail polish.  Do not wear lotions, powders, or perfumes. Do not wear deodorant.  Do not shave 48 hours prior to surgery.  Do not bring valuables to the hospital.  [facility] is not   responsible for any belongings or valuables brought to the hospital.  Contacts, dentures or bridgework may not be worn into surgery.  Leave suitcase in the car. After surgery it may be brought to your room.  For patients admitted to the hospital, checkout time is 11:00 AM the day of              discharge.    N/A   Please read over the following fact sheets that you were given:   Surgical Site Infection Prevention

## 2018-03-07 ENCOUNTER — Encounter (HOSPITAL_COMMUNITY)
Admission: RE | Admit: 2018-03-07 | Discharge: 2018-03-07 | Disposition: A | Discharge status: Home / Self Care | Payer: Medicaid Other | Source: Ambulatory Visit | Attending: Obstetrics and Gynecology | Admitting: Obstetrics and Gynecology

## 2018-03-07 ENCOUNTER — Encounter (HOSPITAL_COMMUNITY): Payer: Self-pay | Admitting: Anesthesiology

## 2018-03-07 LAB — TYPE AND SCREEN
ABO/RH(D): O POS
Antibody Screen: NEGATIVE

## 2018-03-07 LAB — CBC
HEMATOCRIT: 29.3 % — AB (ref 36.0–46.0)
Hemoglobin: 10.1 g/dL — ABNORMAL LOW (ref 12.0–15.0)
MCH: 28.1 pg (ref 26.0–34.0)
MCHC: 34.5 g/dL (ref 30.0–36.0)
MCV: 81.6 fL (ref 78.0–100.0)
PLATELETS: 161 10*3/uL (ref 150–400)
RBC: 3.59 MIL/uL — ABNORMAL LOW (ref 3.87–5.11)
RDW: 13.5 % (ref 11.5–15.5)
WBC: 7.6 10*3/uL (ref 4.0–10.5)

## 2018-03-07 NOTE — Anesthesia Preprocedure Evaluation (Addendum)
Anesthesia Evaluation  Patient identified by MRN, date of birth, ID band Patient awake    Reviewed: Allergy & Precautions, NPO status , Patient's Chart, lab work & pertinent test results  Airway Mallampati: III  TM Distance: >3 FB Neck ROM: Full    Dental no notable dental hx. (+) Teeth Intact   Pulmonary former smoker,    Pulmonary exam normal breath sounds clear to auscultation       Cardiovascular negative cardio ROS Normal cardiovascular exam Rhythm:Regular Rate:Normal     Neuro/Psych  Headaches, negative psych ROS   GI/Hepatic Neg liver ROS, GERD  ,  Endo/Other  Morbid obesity  Renal/GU negative Renal ROS  negative genitourinary   Musculoskeletal negative musculoskeletal ROS (+)   Abdominal (+) + obese,   Peds  Hematology  (+) Sickle cell trait and anemia ,   Anesthesia Other Findings   Reproductive/Obstetrics Previous C/Section HSV                            Anesthesia Physical Anesthesia Plan  ASA: III  Anesthesia Plan: Spinal   Post-op Pain Management:    Induction:   PONV Risk Score and Plan: 4 or greater and Scopolamine patch - Pre-op, Dexamethasone, Ondansetron and Treatment may vary due to age or medical condition  Airway Management Planned: Natural Airway  Additional Equipment:   Intra-op Plan:   Post-operative Plan:   Informed Consent: I have reviewed the patients History and Physical, chart, labs and discussed the procedure including the risks, benefits and alternatives for the proposed anesthesia with the patient or authorized representative who has indicated his/her understanding and acceptance.   Dental advisory given  Plan Discussed with: CRNA and Surgeon  Anesthesia Plan Comments:        Anesthesia Quick Evaluation

## 2018-03-08 ENCOUNTER — Inpatient Hospital Stay (HOSPITAL_COMMUNITY): Payer: Medicaid Other | Admitting: Anesthesiology

## 2018-03-08 ENCOUNTER — Inpatient Hospital Stay (HOSPITAL_COMMUNITY)
Admission: RE | Admit: 2018-03-08 | Discharge: 2018-03-11 | DRG: 788 | Disposition: A | Discharge status: Home / Self Care | Payer: Medicaid Other | Source: Ambulatory Visit | Attending: Obstetrics and Gynecology | Admitting: Obstetrics and Gynecology

## 2018-03-08 ENCOUNTER — Other Ambulatory Visit: Payer: Self-pay

## 2018-03-08 ENCOUNTER — Encounter (HOSPITAL_COMMUNITY)
Admission: RE | Disposition: A | Discharge status: Home / Self Care | Payer: Self-pay | Source: Ambulatory Visit | Attending: Obstetrics and Gynecology

## 2018-03-08 ENCOUNTER — Encounter (HOSPITAL_COMMUNITY): Payer: Self-pay | Admitting: *Deleted

## 2018-03-08 DIAGNOSIS — D573 Sickle-cell trait: Secondary | ICD-10-CM | POA: Diagnosis present

## 2018-03-08 DIAGNOSIS — O34211 Maternal care for low transverse scar from previous cesarean delivery: Secondary | ICD-10-CM | POA: Diagnosis present

## 2018-03-08 DIAGNOSIS — Z3A39 39 weeks gestation of pregnancy: Secondary | ICD-10-CM

## 2018-03-08 DIAGNOSIS — Z87891 Personal history of nicotine dependence: Secondary | ICD-10-CM

## 2018-03-08 DIAGNOSIS — O9902 Anemia complicating childbirth: Secondary | ICD-10-CM | POA: Diagnosis present

## 2018-03-08 DIAGNOSIS — O99824 Streptococcus B carrier state complicating childbirth: Secondary | ICD-10-CM | POA: Diagnosis present

## 2018-03-08 DIAGNOSIS — O99214 Obesity complicating childbirth: Secondary | ICD-10-CM | POA: Diagnosis present

## 2018-03-08 DIAGNOSIS — Z98891 History of uterine scar from previous surgery: Secondary | ICD-10-CM

## 2018-03-08 LAB — RPR: RPR Ser Ql: NONREACTIVE

## 2018-03-08 SURGERY — Surgical Case
Anesthesia: Spinal | Site: Abdomen | Wound class: Clean Contaminated

## 2018-03-08 MED ORDER — COCONUT OIL OIL: 1.0000 "application " | TOPICAL_OIL | Status: DC | PRN

## 2018-03-08 MED ORDER — SIMETHICONE 80 MG PO CHEW
80.0000 mg | CHEWABLE_TABLET | Freq: Three times a day (TID) | ORAL | Status: DC
  Administered 2018-03-08 – 2018-03-11 (×7): 80 mg via ORAL
  Filled 2018-03-08 (×7): qty 1

## 2018-03-08 MED ORDER — MORPHINE SULFATE (PF) 0.5 MG/ML IJ SOLN
INTRAMUSCULAR | Status: DC | PRN
  Administered 2018-03-08: .2 ug via INTRATHECAL

## 2018-03-08 MED ORDER — PRENATAL MULTIVITAMIN CH
1.0000 | ORAL_TABLET | Freq: Every day | ORAL | Status: DC
  Administered 2018-03-09 – 2018-03-11 (×3): 1 via ORAL
  Filled 2018-03-08 (×3): qty 1

## 2018-03-08 MED ORDER — BUPIVACAINE IN DEXTROSE 0.75-8.25 % IT SOLN
INTRATHECAL | Status: AC
  Filled 2018-03-08: qty 2

## 2018-03-08 MED ORDER — METOCLOPRAMIDE HCL 5 MG/ML IJ SOLN
INTRAMUSCULAR | Status: AC
  Filled 2018-03-08: qty 2

## 2018-03-08 MED ORDER — PHENYLEPHRINE 8 MG IN D5W 100 ML (0.08MG/ML) PREMIX OPTIME
INJECTION | INTRAVENOUS | Status: AC
  Filled 2018-03-08: qty 100

## 2018-03-08 MED ORDER — ONDANSETRON HCL 4 MG/2ML IJ SOLN
INTRAMUSCULAR | Status: DC | PRN
  Administered 2018-03-08: 4 mg via INTRAVENOUS

## 2018-03-08 MED ORDER — CEFAZOLIN SODIUM-DEXTROSE 2-4 GM/100ML-% IV SOLN
2.0000 g | INTRAVENOUS | Status: AC
  Administered 2018-03-08: 2 g via INTRAVENOUS

## 2018-03-08 MED ORDER — NALBUPHINE HCL 10 MG/ML IJ SOLN: 5.0000 mg | INTRAMUSCULAR | Status: DC | PRN

## 2018-03-08 MED ORDER — SODIUM CHLORIDE 0.9 % IR SOLN
Status: DC | PRN
  Administered 2018-03-08: 1000 mL

## 2018-03-08 MED ORDER — OXYTOCIN 10 UNIT/ML IJ SOLN
INTRAMUSCULAR | Status: AC
  Filled 2018-03-08: qty 4

## 2018-03-08 MED ORDER — LACTATED RINGERS IV SOLN: INTRAVENOUS | Status: DC

## 2018-03-08 MED ORDER — PHENYLEPHRINE 8 MG IN D5W 100 ML (0.08MG/ML) PREMIX OPTIME
INJECTION | INTRAVENOUS | Status: DC | PRN
  Administered 2018-03-08: 60 ug/min via INTRAVENOUS

## 2018-03-08 MED ORDER — DEXAMETHASONE SODIUM PHOSPHATE 4 MG/ML IJ SOLN
INTRAMUSCULAR | Status: AC
  Filled 2018-03-08: qty 1

## 2018-03-08 MED ORDER — OXYCODONE-ACETAMINOPHEN 5-325 MG PO TABS
2.0000 | ORAL_TABLET | ORAL | Status: DC | PRN
  Administered 2018-03-09 – 2018-03-11 (×8): 2 via ORAL
  Filled 2018-03-08 (×8): qty 2

## 2018-03-08 MED ORDER — LACTATED RINGERS IV SOLN
INTRAVENOUS | Status: DC
  Administered 2018-03-08 (×2): via INTRAVENOUS

## 2018-03-08 MED ORDER — MORPHINE SULFATE (PF) 0.5 MG/ML IJ SOLN
INTRAMUSCULAR | Status: AC
  Filled 2018-03-08: qty 10

## 2018-03-08 MED ORDER — KETOROLAC TROMETHAMINE 30 MG/ML IJ SOLN: 30.0000 mg | Freq: Four times a day (QID) | INTRAMUSCULAR | Status: AC | PRN

## 2018-03-08 MED ORDER — NALOXONE HCL 4 MG/10ML IJ SOLN
1.0000 ug/kg/h | INTRAVENOUS | Status: DC | PRN
  Filled 2018-03-08: qty 5

## 2018-03-08 MED ORDER — ZOLPIDEM TARTRATE 5 MG PO TABS: 5.0000 mg | ORAL_TABLET | Freq: Every evening | ORAL | Status: DC | PRN

## 2018-03-08 MED ORDER — NALOXONE HCL 0.4 MG/ML IJ SOLN: 0.4000 mg | INTRAMUSCULAR | Status: DC | PRN

## 2018-03-08 MED ORDER — OXYTOCIN 40 UNITS IN LACTATED RINGERS INFUSION - SIMPLE MED: 2.5000 [IU]/h | INTRAVENOUS | Status: AC

## 2018-03-08 MED ORDER — ONDANSETRON HCL 4 MG/2ML IJ SOLN: 4.0000 mg | Freq: Three times a day (TID) | INTRAMUSCULAR | Status: DC | PRN

## 2018-03-08 MED ORDER — TETANUS-DIPHTH-ACELL PERTUSSIS 5-2.5-18.5 LF-MCG/0.5 IM SUSP: 0.5000 mL | Freq: Once | INTRAMUSCULAR | Status: DC

## 2018-03-08 MED ORDER — DIPHENHYDRAMINE HCL 50 MG/ML IJ SOLN: 12.5000 mg | INTRAMUSCULAR | Status: DC | PRN

## 2018-03-08 MED ORDER — FENTANYL CITRATE (PF) 100 MCG/2ML IJ SOLN
INTRAMUSCULAR | Status: DC | PRN
  Administered 2018-03-08: 20 ug via INTRATHECAL

## 2018-03-08 MED ORDER — PROMETHAZINE HCL 25 MG/ML IJ SOLN: 6.2500 mg | Freq: Four times a day (QID) | INTRAMUSCULAR | Status: DC | PRN

## 2018-03-08 MED ORDER — OXYTOCIN 10 UNIT/ML IJ SOLN
INTRAVENOUS | Status: DC | PRN
  Administered 2018-03-08: 40 [IU] via INTRAVENOUS

## 2018-03-08 MED ORDER — SODIUM CHLORIDE 0.9% FLUSH: 3.0000 mL | INTRAVENOUS | Status: DC | PRN

## 2018-03-08 MED ORDER — METOCLOPRAMIDE HCL 5 MG/ML IJ SOLN
10.0000 mg | Freq: Once | INTRAMUSCULAR | Status: AC
  Administered 2018-03-08: 10 mg via INTRAVENOUS

## 2018-03-08 MED ORDER — WITCH HAZEL-GLYCERIN EX PADS: 1.0000 "application " | MEDICATED_PAD | CUTANEOUS | Status: DC | PRN

## 2018-03-08 MED ORDER — DIPHENHYDRAMINE HCL 25 MG PO CAPS: 25.0000 mg | ORAL_CAPSULE | Freq: Four times a day (QID) | ORAL | Status: DC | PRN

## 2018-03-08 MED ORDER — FENTANYL CITRATE (PF) 100 MCG/2ML IJ SOLN: 25.0000 ug | INTRAMUSCULAR | Status: DC | PRN

## 2018-03-08 MED ORDER — CEFAZOLIN SODIUM-DEXTROSE 2-4 GM/100ML-% IV SOLN
INTRAVENOUS | Status: AC
  Filled 2018-03-08: qty 100

## 2018-03-08 MED ORDER — SIMETHICONE 80 MG PO CHEW: 80.0000 mg | CHEWABLE_TABLET | ORAL | Status: DC | PRN

## 2018-03-08 MED ORDER — OXYCODONE-ACETAMINOPHEN 5-325 MG PO TABS
1.0000 | ORAL_TABLET | ORAL | Status: DC | PRN
  Administered 2018-03-08 – 2018-03-09 (×4): 1 via ORAL
  Filled 2018-03-08 (×4): qty 1

## 2018-03-08 MED ORDER — PROMETHAZINE HCL 25 MG/ML IJ SOLN
12.5000 mg | Freq: Four times a day (QID) | INTRAMUSCULAR | Status: DC | PRN
  Administered 2018-03-08: 12.5 mg via INTRAVENOUS
  Filled 2018-03-08: qty 1

## 2018-03-08 MED ORDER — STERILE WATER FOR IRRIGATION IR SOLN
Status: DC | PRN
  Administered 2018-03-08: 1000 mL

## 2018-03-08 MED ORDER — SCOPOLAMINE 1 MG/3DAYS TD PT72
1.0000 | MEDICATED_PATCH | Freq: Once | TRANSDERMAL | Status: DC
  Administered 2018-03-08: 1.5 mg via TRANSDERMAL
  Filled 2018-03-08: qty 1

## 2018-03-08 MED ORDER — NALBUPHINE HCL 10 MG/ML IJ SOLN: 5.0000 mg | Freq: Once | INTRAMUSCULAR | Status: DC | PRN

## 2018-03-08 MED ORDER — IBUPROFEN 600 MG PO TABS
600.0000 mg | ORAL_TABLET | Freq: Four times a day (QID) | ORAL | Status: DC
  Administered 2018-03-08 – 2018-03-11 (×12): 600 mg via ORAL
  Filled 2018-03-08 (×12): qty 1

## 2018-03-08 MED ORDER — LACTATED RINGERS IV SOLN
INTRAVENOUS | Status: DC | PRN
  Administered 2018-03-08: 08:00:00 via INTRAVENOUS

## 2018-03-08 MED ORDER — SOD CITRATE-CITRIC ACID 500-334 MG/5ML PO SOLN
30.0000 mL | Freq: Once | ORAL | Status: AC
  Administered 2018-03-08: 30 mL via ORAL
  Filled 2018-03-08: qty 15

## 2018-03-08 MED ORDER — ACETAMINOPHEN 325 MG PO TABS: 650.0000 mg | ORAL_TABLET | ORAL | Status: DC | PRN

## 2018-03-08 MED ORDER — FENTANYL CITRATE (PF) 100 MCG/2ML IJ SOLN
INTRAMUSCULAR | Status: AC
  Filled 2018-03-08: qty 2

## 2018-03-08 MED ORDER — SENNOSIDES-DOCUSATE SODIUM 8.6-50 MG PO TABS
2.0000 | ORAL_TABLET | ORAL | Status: DC
  Administered 2018-03-09 – 2018-03-10 (×3): 2 via ORAL
  Filled 2018-03-08 (×3): qty 2

## 2018-03-08 MED ORDER — LACTATED RINGERS IV BOLUS
500.0000 mL | Freq: Once | INTRAVENOUS | Status: AC
Start: 2018-03-08 — End: 2018-03-08
  Administered 2018-03-08: 500 mL via INTRAVENOUS

## 2018-03-08 MED ORDER — BUPIVACAINE IN DEXTROSE 0.75-8.25 % IT SOLN
INTRATHECAL | Status: DC | PRN
  Administered 2018-03-08: 1.7 mL via INTRATHECAL

## 2018-03-08 MED ORDER — MEPERIDINE HCL 25 MG/ML IJ SOLN: 6.2500 mg | INTRAMUSCULAR | Status: DC | PRN

## 2018-03-08 MED ORDER — LACTATED RINGERS IV SOLN
INTRAVENOUS | Status: DC
  Administered 2018-03-08: 1 mL via INTRAVENOUS

## 2018-03-08 MED ORDER — DIBUCAINE 1 % RE OINT: 1.0000 "application " | TOPICAL_OINTMENT | RECTAL | Status: DC | PRN

## 2018-03-08 MED ORDER — ONDANSETRON HCL 4 MG/2ML IJ SOLN
INTRAMUSCULAR | Status: AC
  Filled 2018-03-08: qty 2

## 2018-03-08 MED ORDER — KETOROLAC TROMETHAMINE 30 MG/ML IJ SOLN
INTRAMUSCULAR | Status: AC
  Filled 2018-03-08: qty 1

## 2018-03-08 MED ORDER — MENTHOL 3 MG MT LOZG: 1.0000 | LOZENGE | OROMUCOSAL | Status: DC | PRN

## 2018-03-08 MED ORDER — SIMETHICONE 80 MG PO CHEW
80.0000 mg | CHEWABLE_TABLET | ORAL | Status: DC
  Administered 2018-03-09 – 2018-03-10 (×3): 80 mg via ORAL
  Filled 2018-03-08 (×3): qty 1

## 2018-03-08 MED ORDER — PHENYLEPHRINE 40 MCG/ML (10ML) SYRINGE FOR IV PUSH (FOR BLOOD PRESSURE SUPPORT)
PREFILLED_SYRINGE | INTRAVENOUS | Status: AC
  Filled 2018-03-08: qty 10

## 2018-03-08 MED ORDER — KETOROLAC TROMETHAMINE 30 MG/ML IJ SOLN
30.0000 mg | Freq: Four times a day (QID) | INTRAMUSCULAR | Status: AC | PRN
  Administered 2018-03-08: 30 mg via INTRAMUSCULAR

## 2018-03-08 MED ORDER — DEXAMETHASONE SODIUM PHOSPHATE 4 MG/ML IJ SOLN
INTRAMUSCULAR | Status: DC | PRN
  Administered 2018-03-08: 4 mg via INTRAVENOUS

## 2018-03-08 MED ORDER — NALBUPHINE HCL 10 MG/ML IJ SOLN
5.0000 mg | Freq: Once | INTRAMUSCULAR | Status: DC | PRN
Start: 2018-03-08 — End: 2018-03-11

## 2018-03-08 MED ORDER — DIPHENHYDRAMINE HCL 25 MG PO CAPS
25.0000 mg | ORAL_CAPSULE | ORAL | Status: DC | PRN
  Filled 2018-03-08: qty 1

## 2018-03-08 SURGICAL SUPPLY — 29 items
CHLORAPREP W/TINT 26ML (MISCELLANEOUS) ×3 IMPLANT
CLAMP CORD UMBIL (MISCELLANEOUS) ×2 IMPLANT
CLOTH BEACON ORANGE TIMEOUT ST (SAFETY) ×3 IMPLANT
DRSG OPSITE POSTOP 4X10 (GAUZE/BANDAGES/DRESSINGS) ×3 IMPLANT
ELECT REM PT RETURN 9FT ADLT (ELECTROSURGICAL) ×3
ELECTRODE REM PT RTRN 9FT ADLT (ELECTROSURGICAL) ×1 IMPLANT
GLOVE BIOGEL PI IND STRL 6.5 (GLOVE) ×1 IMPLANT
GLOVE BIOGEL PI IND STRL 7.0 (GLOVE) ×1 IMPLANT
GLOVE BIOGEL PI INDICATOR 6.5 (GLOVE) ×2
GLOVE BIOGEL PI INDICATOR 7.0 (GLOVE) ×2
GLOVE ECLIPSE 6.5 STRL STRAW (GLOVE) ×3 IMPLANT
GOWN STRL REUS W/TWL LRG LVL3 (GOWN DISPOSABLE) ×6 IMPLANT
NS IRRIG 1000ML POUR BTL (IV SOLUTION) ×3 IMPLANT
PACK C SECTION WH (CUSTOM PROCEDURE TRAY) ×3 IMPLANT
PAD ABD 7.5X8 STRL (GAUZE/BANDAGES/DRESSINGS) IMPLANT
PAD OB MATERNITY 4.3X12.25 (PERSONAL CARE ITEMS) ×3 IMPLANT
PENCIL SMOKE EVAC W/HOLSTER (ELECTROSURGICAL) ×3 IMPLANT
RTRCTR C-SECT PINK 25CM LRG (MISCELLANEOUS) ×3 IMPLANT
SPONGE LAP 18X18 X RAY DECT (DISPOSABLE) ×2 IMPLANT
STAPLER VISISTAT 35W (STAPLE) ×2 IMPLANT
SUT MON AB 2-0 CT1 27 (SUTURE) ×3 IMPLANT
SUT MON AB 4-0 PS1 27 (SUTURE) IMPLANT
SUT PDS AB 0 CTX 60 (SUTURE) ×4 IMPLANT
SUT PLAIN 2 0 XLH (SUTURE) ×2 IMPLANT
SUT VIC AB 0 CTX 36 (SUTURE) ×12
SUT VIC AB 0 CTX36XBRD ANBCTRL (SUTURE) ×4 IMPLANT
SUT VIC AB 4-0 KS 27 (SUTURE) ×2 IMPLANT
TOWEL OR 17X24 6PK STRL BLUE (TOWEL DISPOSABLE) ×3 IMPLANT
TRAY FOLEY W/BAG SLVR 14FR LF (SET/KITS/TRAYS/PACK) ×3 IMPLANT

## 2018-03-08 NOTE — Lactation Note (Signed)
This note was copied from a baby's chart. Lactation Consultation Note  Patient Name: Michele Becker [id number] Date: 03/08/2018 Reason for consult: Initial assessment;Hyperbilirubinemia  Mom reports hearing many swallows at the last feeding. Mom will pump again soon.   Lurline Hare [facility] 03/08/2018, 7:56 PM

## 2018-03-08 NOTE — Addendum Note (Signed)
Addendum  created 03/08/18 1652 by Algis Greenhouse, CRNA   Sign clinical note

## 2018-03-08 NOTE — Brief Op Note (Signed)
03/08/2018  8:48 AM  PATIENT:  Michele Becker  32 y.o. female  PRE-OPERATIVE DIAGNOSIS:  previous cesarean section  POST-OPERATIVE DIAGNOSIS:  previous cesarean section  PROCEDURE:  Procedure(s): CESAREAN SECTION (N/A) - low transvers  SURGEON:  Surgeon(s) and Role:    * Philip Aspen, DO - Primary Carrington Clamp, MD- Assist  ANESTHESIA:   spinal  EBL:  507 mL    FINDINGS: female infant, cephalic presentation, APGARS 8/9, bilateral tubes and ovaries normal, mild scarring of bladder to lower uterus, thick adhesions with indistinct layers of anterior abd wall.  Very thin myometrium at LUS, window to amniotic sac apparent and reported to patient  SPECIMEN:  Source of Specimen:  cord blood  DISPOSITION OF SPECIMEN:  N/A  COUNTS:  YES  PLAN OF CARE: Admit to inpatient   PATIENT DISPOSITION:  PACU - hemodynamically stable.   Delay start of Pharmacological VTE agent (>24hrs) due to surgical blood loss or risk of bleeding: not applicable

## 2018-03-08 NOTE — Consult Note (Signed)
Neonatology Note:   Attendance at C-section:    I was asked by Dr. Callahan to attend this repeat C/S at term. The mother is a G4P2, GBS + with good prenatal care. Pregnancy complicated by HSV (no lesions for years), THC and ETOH use. No labor or fever. ROM 0 hours before delivery, fluid clear. Infant vigorous with good spontaneous cry and tone. +30 sec DCC.  Needed only minimal bulb suctioning. +void and stool.  Ap 8/9. Lungs clear to ausc in DR. To CN to care of Pediatrician.  David C. Ehrmann, MD 

## 2018-03-08 NOTE — Anesthesia Postprocedure Evaluation (Signed)
Anesthesia Post Note  Patient: Michele Becker  Procedure(s) Performed: CESAREAN SECTION (N/A Abdomen)     Patient location during evaluation: Mother Baby Anesthesia Type: Spinal Level of consciousness: awake Pain management: satisfactory to patient Vital Signs Assessment: post-procedure vital signs reviewed and stable Respiratory status: spontaneous breathing Cardiovascular status: stable Anesthetic complications: no    Last Vitals:  Vitals:   03/08/18 1406 03/08/18 1600  BP: 128/83 124/88  Pulse: 85   Resp:    Temp: 36.7 C   SpO2: 99% 100%    Last Pain:  Vitals:   03/08/18 1412  TempSrc:   PainSc: 2    Pain Goal: Patients Stated Pain Goal: 5 (03/08/18 1224)               Cephus Shelling

## 2018-03-08 NOTE — H&P (Signed)
32 y.o. [redacted]w[redacted]d  [id number] comes in for scheduled repeat cesarean section.  Otherwise has good fetal movement and no bleeding.  Past Medical History:  Diagnosis Date  . Bacterial vaginosis 07/22/2013  . Genital herpes    no outbreaks in years  . Headache(784.0)    Migraines as child  . HSV (herpes simplex virus) anogenital infection   . Peripheral edema   . Sickle cell trait (HCC)   . Trichomonas vaginitis     Past Surgical History:  Procedure Laterality Date  . CESAREAN SECTION     Pt states no fluid around baby  . CESAREAN SECTION N/A 12/08/2013   Procedure: CESAREAN SECTION;  Surgeon: Oliver Pila, MD;  Location: [facility];  Service: Obstetrics;  Laterality: N/A;  . WISDOM TOOTH EXTRACTION      OB History  Gravida Para Term Preterm AB Living  SAB TAB Ectopic Multiple Live Births  1       2    # Outcome Date GA Lbr Len/2nd Weight Sex Delivery Anes PTL Lv  4 Current           3 Term 12/08/13 [redacted]w[redacted]d  7 lb 8.1 oz (3.405 kg) M CS-LTranv Spinal  LIV  2 Term 02/01/10     CS-LTranv  N      Birth Comments: Pt states no fluid  1 SAB 2008            Social History   Socioeconomic History  . Marital status: Married    Spouse name: Not on file  . Number of children: Not on file  . Years of education: Not on file  . Highest education level: Not on file  Occupational History  . Not on file  Social Needs  . Financial resource strain: Not on file  . Food insecurity:    Worry: Not on file    Inability: Not on file  . Transportation needs:    Medical: Not on file    Non-medical: Not on file  Tobacco Use  . Smoking status: Former Games developer  . Smokeless tobacco: Never Used  Substance and Sexual Activity  . Alcohol use: Yes    Alcohol/week: 16.8 oz    Types: 14 Cans of beer, 14 Standard drinks or equivalent per week    Comment: occasional with pregnancy  . Drug use: Yes    Types: Marijuana    Comment: occasional  . Sexual activity: Not on file  Lifestyle  .  Physical activity:    Days per week: Not on file    Minutes per session: Not on file  . Stress: Not on file  Relationships  . Social connections:    Talks on phone: Not on file    Gets together: Not on file    Attends religious service: Not on file    Active member of club or organization: Not on file    Attends meetings of clubs or organizations: Not on file    Relationship status: Not on file  . Intimate partner violence:    Fear of current or ex partner: Not on file    Emotionally abused: Not on file    Physically abused: Not on file    Forced sexual activity: Not on file  Other Topics Concern  . Not on file  Social History Narrative  . Not on file   Patient has no known allergies.    Prenatal Transfer Tool  Maternal Diabetes: No Genetic  Screening: Normal Maternal Ultrasounds/Referrals: Normal Fetal Ultrasounds or other Referrals:  None Maternal Substance Abuse:  No Significant Maternal Medications:  None Significant Maternal Lab Results: Lab values include: Group B Strep positive  Other PNC: uncomplicated.    Vitals:   03/08/18 0556  BP: 138/90  Pulse: 97  Resp: 16  Temp: 97.8 F (36.6 C)  TempSrc: Oral  Weight: 260 lb 8 oz (118.2 kg)  Height:  (1.702 m)    Lungs/Cor:  NAD Abdomen:  soft, gravid Ex:  no cords, erythema SVE:  deferred FHTs:  142 by dopple Toco:  deferred   A/P   Admit for schedule repeat c/s  2g ancef  GBS Pos  SCDs and other routine pre-op care  Haskins, Luther Parody

## 2018-03-08 NOTE — Transfer of Care (Signed)
Immediate Anesthesia Transfer of Care Note  Patient: Terrilee S Clovis Riley  Procedure(s) Performed: CESAREAN SECTION (N/A Abdomen)  Patient Location: PACU  Anesthesia Type:Spinal  Level of Consciousness: awake, alert  and oriented  Airway & Oxygen Therapy: Patient Spontanous Breathing  Post-op Assessment: Report given to RN and Post -op Vital signs reviewed and stable  Post vital signs: Reviewed and stable  Last Vitals:  Vitals Value Taken Time  BP    Temp    Pulse 72 03/08/2018  8:58 AM  Resp    SpO2 97 % 03/08/2018  8:58 AM  Vitals shown include unvalidated device data.  Last Pain:  Vitals:   03/08/18 0556  TempSrc: Oral  PainSc: 0-No pain         Complications: No apparent anesthesia complications

## 2018-03-08 NOTE — Addendum Note (Signed)
Addendum  created 03/08/18 0941 by Mal Amabile, MD   Intraprocedure Blocks edited, Sign clinical note

## 2018-03-08 NOTE — Anesthesia Procedure Notes (Addendum)
Spinal  Patient location during procedure: OR Start time: 03/08/2018 7:32 AM Staffing Anesthesiologist: Mal Amabile, MD Performed: anesthesiologist  Preanesthetic Checklist Completed: patient identified, site marked, surgical consent, pre-op evaluation, timeout performed, IV checked, risks and benefits discussed and monitors and equipment checked Spinal Block Patient position: sitting Prep: site prepped and draped and DuraPrep Patient monitoring: heart rate, cardiac monitor, continuous pulse ox and blood pressure Approach: midline Location: L3-4 Injection technique: single-shot Needle Needle type: Pencan  Needle gauge: 24 G Needle length: 9 cm Needle insertion depth: 7 cm Assessment Sensory level: T4 Additional Notes Patient tolerated procedure well.  Adequate sensory level.

## 2018-03-08 NOTE — Lactation Note (Signed)
This note was copied from a baby's chart. Lactation Consultation Note  Patient Name: Michele Becker [id number] Date: 03/08/2018 Reason for consult: Initial assessment;Hyperbilirubinemia  Initial visit at 8 hours of life. Infant is DAT+ and is already under phototherapy. Mom is a P3 who nursed her 2 other children for 2 months and then pumped for an additional 2 months. Mom reports that her milk comes in around the 3rd day postpartum and that she has an abundant supply once her milk comes to volume.  Infant has been to the breast 3 times prior to consult w/LATCH scores of 8-9. Infant went to breast twice during consult, but acted sleepy and did not effectively suckle at that time.   Hand expression was taught to Mom. Mom was shown how to use DEBP & how to disassemble, clean, & reassemble parts. Infant was spoon-fed/finger-fed resulting EBM (about 2mL from prior hand expression and DEBP). Infant noted to have a functional suck on oral exam.  Mom has my # to call for assist w/next feeding.   Lurline Hare [facility] 03/08/2018, 4:52 PM

## 2018-03-08 NOTE — Anesthesia Postprocedure Evaluation (Signed)
Anesthesia Post Note  Patient: Michele Becker  Procedure(s) Performed: CESAREAN SECTION (N/A Abdomen)     Patient location during evaluation: PACU Anesthesia Type: Spinal Level of consciousness: oriented and awake and alert Pain management: pain level controlled Vital Signs Assessment: post-procedure vital signs reviewed and stable Respiratory status: spontaneous breathing, respiratory function stable and nonlabored ventilation Cardiovascular status: blood pressure returned to baseline and stable Postop Assessment: no headache, no backache, no apparent nausea or vomiting, patient able to bend at knees and spinal receding Anesthetic complications: no    Last Vitals:  Vitals:   03/08/18 0900 03/08/18 0915  BP: (!) 122/91 134/85  Pulse: 72 86  Resp: 11 18  Temp: (!) 36.3 C   SpO2: 93% 98%    Last Pain:  Vitals:   03/08/18 0900  TempSrc: Oral  PainSc: 0-No pain   Pain Goal:                 Yumna Ebers A.

## 2018-03-08 NOTE — Lactation Note (Signed)
This note was copied from a baby's chart. Lactation Consultation Note  Patient Name: Michele Becker [id number] Date: 03/08/2018 Reason for consult: Hyperbilirubinemia   Mom reports that infant has been feeding for the last 50 minutes and that she has heard frequent swallows. Infant not swallowing as much in my presence, but Mom felt that was because "Michele Becker" was getting full.   Lurline Hare [facility] 03/08/2018, 9:27 PM

## 2018-03-09 LAB — CBC
HCT: 25.9 % — ABNORMAL LOW (ref 36.0–46.0)
Hemoglobin: 9.1 g/dL — ABNORMAL LOW (ref 12.0–15.0)
MCH: 28.6 pg (ref 26.0–34.0)
MCHC: 35.1 g/dL (ref 30.0–36.0)
MCV: 81.4 fL (ref 78.0–100.0)
PLATELETS: 154 10*3/uL (ref 150–400)
RBC: 3.18 MIL/uL — ABNORMAL LOW (ref 3.87–5.11)
RDW: 13.2 % (ref 11.5–15.5)
WBC: 10.9 10*3/uL — ABNORMAL HIGH (ref 4.0–10.5)

## 2018-03-09 LAB — BIRTH TISSUE RECOVERY COLLECTION (PLACENTA DONATION)

## 2018-03-09 NOTE — Progress Notes (Signed)
Patient is eating, ambulating, voiding.  Pain control is good.  Appropriate lochia, no complaints.  Vitals:   03/09/18 0015 03/09/18 0246 03/09/18 0414 03/09/18 0845  BP: 120/69  119/86 139/81  Pulse: 68  77 72  Resp: Temp: 97.7 F (36.5 C)  98.3 F (36.8 C) 98.2 F (36.8 C)  TempSrc: Oral  Oral Oral  SpO2: 99% 97% 99% 97%  Weight:      Height:        Fundus firm Inc: c/d/i Ext: no calf tenderness  Lab Results  Component Value Date   WBC 10.9 (H) 03/09/2018   HGB 9.1 (L) 03/09/2018   HCT 25.9 (L) 03/09/2018   MCV 81.4 03/09/2018   PLT 154 03/09/2018    --/--/O POS (05/23 1100)  A/P Post op day #1 Encourage ambulation  Routine care.  Expect d/c 5/26  [place], [place]

## 2018-03-09 NOTE — Lactation Note (Addendum)
This note was copied from a baby's chart. Lactation Consultation Note  Patient Name: Michele Becker [id number] Date: 03/09/2018 Reason for consult: Follow-up assessment;Term;Hyperbilirubinemia  31 hours old FT female who is being exclusively BF by his mother. Baby is on double phototherapy and mom has already stopped pumping, she didn't pump today. Baby wasn't under the lights when entering the room, per parents, they're just finished changing baby's diaper. When asked mom if she was aware of the bilirubin going up, she said  Yes. Explained to mom the importance of consistent pumping and providing baby a supplement of breastmilk in order to quantify his intake. Mom said she stopped pumping because she feels very positive about BF, she has milk squirting out of her breasts already. But she verbalized understanding and voiced she'll start pumping again every 3 hours after feedings. Mom will continue feeding STS baby 8-12 times/24 hours or sooner if feeding cues are present. Parents aware of LC services and will call PRN.  Maternal Data    Feeding Feeding Type: Breast Fed Length of feed: 25 min  LATCH Score Latch: Grasps breast easily, tongue down, lips flanged, rhythmical sucking.(per mom)  Audible Swallowing: A few with stimulation  Type of Nipple: Everted at rest and after stimulation  Comfort (Breast/Nipple): Soft / non-tender  Hold (Positioning): No assistance needed to correctly position infant at breast.  LATCH Score: 9  Interventions Interventions: Breast feeding basics reviewed;DEBP  Lactation Tools Discussed/Used     Consult Status Consult Status: Follow-up Date: 03/10/18 Follow-up type: In-patient    [name] [name] 03/09/2018, 11:28 PM

## 2018-03-10 NOTE — Lactation Note (Signed)
This note was copied from a baby's chart. Lactation Consultation Note  Patient Name: Michele Tijuana MitcDeardra HinkleyDate: 03/10/2018 Reason for consult: Follow-up assessment;Term;Hyperbilirubinemia;Infant weight loss;Other (Comment)(DAT (+))  63 hours old FT female who is being moslty BF by her mother; only one formula feeding noted on flowsheet. Baby is DAT (+), bilirrubin keeps going up, it has not improved, and baby is now at 8% weight loss. Mom has been doing a great job at feeding baby at the breast and pumping but she hasn't been supplementing baby with her EBM. Mom has been giving her nurse all her EBM to put it in the unit fridge, she had 3 containers, LC retrieved the oldest one and brought it to mom's room so it's available the next time baby is ready to feed.  Per mom feedings at the breast are still comfortable, she hears swallows and she's now pumping after every feeding; getting about 3 ounces of breastmilk on each pumping session mom has an abundant milk supply. Praised mom for her efforts. Discussed about supplementation guidelines according to baby's age, mom will be supplementing baby with 30 ml  of breastmilk after feedings at the breast.  Encouraged to keep feeding baby STS 8-12 times/24 hours or sooner if feeding cues are present. She'll also start supplementing baby her EBM afterwards and continue pumping. Mom aware of LC services and will call PRN.    Maternal Data    Feeding Feeding Type: Breast Fed   Interventions Interventions: Breast feeding basics reviewed;Expressed milk  Lactation Tools Discussed/Used     Consult Status Consult Status: Follow-up Date: 03/11/18 Follow-up type: In-patient    [name] [name] 03/10/2018, 9:11 PM

## 2018-03-10 NOTE — Progress Notes (Signed)
Patient is eating, ambulating, voiding.  Pain control is good.  Appropriate lochia, passing gas.  No complaints.    Vitals:   03/09/18 0845 03/09/18 1756 03/09/18 1818 03/10/18 0524  BP: 139/81  126/83 (!) 142/91  Pulse: 72 73  89  Resp: Temp: 98.2 F (36.8 C) 97.6 F (36.4 C)  97.9 F (36.6 C)  TempSrc: Oral Oral  Oral  SpO2: 97%     Weight:      Height:        Fundus firm Inc: c/d/i Ext: no calf tenderness  Lab Results  Component Value Date   WBC 10.9 (H) 03/09/2018   HGB 9.1 (L) 03/09/2018   HCT 25.9 (L) 03/09/2018   MCV 81.4 03/09/2018   PLT 154 03/09/2018    --/--/O POS (05/23 1100)  A/P Post op day #2 s/p repeat c/s Doing well, desires add'l day in hospital  Routine care.  Expect d/c 5/27.    Philip Aspen

## 2018-03-11 MED ORDER — OXYCODONE-ACETAMINOPHEN 5-325 MG PO TABS: 1.0000 | ORAL_TABLET | Freq: Four times a day (QID) | ORAL | 0 refills | Status: DC | PRN

## 2018-03-11 MED ORDER — DOCUSATE SODIUM 100 MG PO CAPS: 100.0000 mg | ORAL_CAPSULE | Freq: Two times a day (BID) | ORAL | 0 refills | Status: DC

## 2018-03-11 MED ORDER — IBUPROFEN 600 MG PO TABS: 600.0000 mg | ORAL_TABLET | Freq: Four times a day (QID) | ORAL | 0 refills | Status: DC | PRN

## 2018-03-11 NOTE — Discharge Summary (Signed)
Obstetric Discharge Summary Reason for Admission: cesarean section Prenatal Procedures: ultrasound Intrapartum Procedures: cesarean: low cervical, transverse Postpartum Procedures: none Complications-Operative and Postpartum: none Hemoglobin  Date Value Ref Range Status  03/09/2018 9.1 (L) 12.0 - 15.0 g/dL Final   HCT  Date Value Ref Range Status  03/09/2018 25.9 (L) 36.0 - 46.0 % Final    Physical Exam:  General: alert, cooperative and appears stated age 30: appropriate Uterine Fundus: firm Incision: healing well DVT Evaluation: No evidence of DVT seen on physical exam.  Discharge Diagnoses: Term Pregnancy-delivered  Discharge Information: Date: 03/11/2018 Activity: pelvic rest Diet: routine Medications: Ibuprofen, Colace and Percocet Condition: improved Instructions: refer to practice specific booklet Discharge to: home Follow-up Information    Philip Aspen, DO Follow up in 2 week(s).   Specialty:  Obstetrics and Gynecology Why:  For an incision check Contact information: [id number] [place] [address] [phone]           Newborn Data: Live born female  Birth Weight: 7 lb 7.6 oz (3390 g) APGAR: 8, 9  Newborn Delivery   Birth date/time:  03/08/2018 08:02:00 Delivery type:  C-Section, Low Transverse Trial of labor:  No C-section categorization:  Repeat     Home with mother.  Waynard Reeds 03/11/2018, 10:37 AM

## 2018-03-11 NOTE — Discharge Instructions (Signed)

## 2018-03-11 NOTE — Lactation Note (Signed)
This note was copied from a baby's chart. Lactation Consultation Note  Patient Name: Michele Becker Date: 03/11/2018 Reason for consult: Follow-up assessment;Term;Hyperbilirubinemia;Other (Comment)(baby gained weight , home on double photo )  Baby is 51 hours old and mom , dad and baby ready for D/C.  Per the photo lights were delivered. Baby still on lights in the room.  LC reviewed engorgement prevention and tx.  Per mom has  DEBP at home [facility] )  LC discussed the effects on the baby and that the baby can act potentially  Sluggish due to the jaundice. Nutritive vs non - nutritive feeding patterns and to watch for  Hanging out latched.  STS feedings until the baby can stay awake for feedings with lights.  Mother informed of post-discharge support and given phone number to the lactation department, including services for phone call assistance; out-patient appointments; and breastfeeding support group. List of other breastfeeding resources in the community given in the handout. Encouraged mother to call for problems or concerns related to breastfeeding.   Maternal Data Has patient been taught Hand Expression?: Yes(per  mom familiar )  Feeding Feeding Type: (per mom baby recently fed at 1125 for 20 mins ) Length of feed: 20 min(per mom )  LATCH Score                   Interventions Interventions: Breast feeding basics reviewed  Lactation Tools Discussed/Used WIC Program: No   Consult Status Consult Status: Complete Date: 03/11/18    Matilde Sprang Sarayu Prevost 03/11/2018, 1:13 PM

## 2018-03-13 NOTE — Op Note (Signed)
NAME: Michele Becker, Michele Becker MEDICAL RECORD [id number] ACCOUNT [id number] DATE OF BIRTH:09/03/1986 FACILITY: [facility] LOCATION: [id number] PHYSICIAN:Emilie Carp M. Claiborne Billings, DO  OPERATIVE REPORT  DATE OF PROCEDURE:  03/08/2018  DATE OF PROCEDURE:  03/08/2018.  PREOPERATIVE DIAGNOSIS:  Previous cesarean section.  POSTOPERATIVE DIAGNOSIS:  Desired repeat cesarean section.  PROCEDURE:  Low transverse cesarean section.  SURGEON:  Philip Aspen, D.O.  ASSISTANT:  Thornell Mule, M.D.  ANESTHESIA:  Spinal.  ESTIMATED BLOOD LOSS:  507 mL.  FINDINGS:  Female infant in cephalic presentation with Apgars 8 and 9.  Bilateral tubes and ovaries normal.  Mild scarring of bladder to lower uterus.  Thick adhesions with indistinct layers of the anterior abdominal wall, very thin myometrium at lower  uterine segment window to amniotic sac apparent and reported to patient.  SPECIMENS:  Cord blood.  COMPLICATIONS:  None.  CONDITION:  Stable to PACU.  DESCRIPTION OF PROCEDURE:  The patient was taken to the operating room where spinal anesthesia was administered and found to be adequate.  She was prepped and draped in the normal sterile fashion in dorsal supine position with a leftward tilt.  A scalpel  was used to make a Pfannenstiel skin incision which was carried down to the underlying layer of fascia with Bovie cautery.  The fascia was incised with a scalpel in the midline and extended laterally with Mayo scissors.  Kocher clamps were placed at the  superior aspect of the fascial incision and sharp dissection was performed with scalpel and Bovie.  Kocher clamps were removed and hemostats were used to separate tissue at the midline.  A small window to the peritoneum was developed and entered  bluntly.  This was extended by lateral manual traction.  The abdomen and pelvis was surveyed manually and no scar tissue was palpated.  The Alexis self-retractor was placed and the vesicouterine peritoneum was  identified, tented, and entered sharply with  Metzenbaum scissors.  The bladder flap was developed digitally and with additional use of Metzenbaum scissors.  The findings are noted above.  Scalpel was used to enter the amniotic sac.  The incision was extended via cephalic and caudal traction.  The  infant's head was easily elevated and delivered followed by the remainder of the infant's body.  Delayed cord clamping was performed between 30 and 60 seconds as the infant was bulb suctioned.  The cord was then clamped and cut and the infant was handed  off to awaiting neonatologist.  Cord blood was collected and external massage of the uterus with gentle traction on the umbilical cord was used to facilitate removal of placenta.  Uterus was cleared of all clot and debris and the uterine incision was  then reapproximated and closed with Vicryl in a running locked fashion followed by a second layer of vertical imbrication.  Excellent hemostasis was noted.  The [employer] was removed.  Peritoneum was identified, reapproximated and closed with  Monocryl in a running fashion followed by 3 loops to reapproximate the rectus muscles in the lower aspect of the abdomen.  All layers were inspected and Bovie cautery was used to control multiple small bleeders.  The fascia was identified and  reapproximated and closed with 2 loops of PDS starting at angles and working towards the center.  The subcutaneous tissue was irrigated and dried and Bovie cautery was used to control small bleeders.  Chromic was used to reapproximate subcutaneous tissue  and 5 interrupted sutures.  The skin was reapproximated and closed with staples.  The patient  tolerated the procedure well.  Sponge, lap and needle counts were correct x2.  The patient was taken to recovery in stable condition.  TN/NUANCE  D:03/13/2018 T:03/13/2018 JOB:[id number]

## 2019-02-19 ENCOUNTER — Telehealth: Payer: Self-pay | Admitting: Lactation Services

## 2019-02-19 ENCOUNTER — Telehealth (INDEPENDENT_AMBULATORY_CARE_PROVIDER_SITE_OTHER): Payer: Medicaid Other | Admitting: Lactation Services

## 2019-02-19 DIAGNOSIS — O3680X0 Pregnancy with inconclusive fetal viability, not applicable or unspecified: Secondary | ICD-10-CM

## 2019-02-19 NOTE — Telephone Encounter (Signed)
Called and LM for [name] that Pt has been scheduled for follow up US on 5/13 @ 8 am.

## 2019-02-19 NOTE — Telephone Encounter (Signed)
Called to inform patient that she I received a call from [place] at [facility] and we would like to follow up her appt with an Ultrasound on 5/13 @ 08:00 am. Informed pt to arrive at 07:45 and to have a full bladder. Pt voiced understanding.

## 2019-02-19 NOTE — Telephone Encounter (Signed)
[name] from [facility] called to report a visit she had with above pt.   Pt with last LMP 12/13/18 suggesting 9 week pregnancy.  Last Depo shot was in June 2019. Pt with positive pregnancy test. [place] was performed and found to have 2 gestational sacs. Sac A with fetus with heart activity, Sac B was vacant. Pt with spotting last week.   Eileen Stanford would like for pt to be followed up at [facility]. Please call patient with follow up as indicated.   Routed to Clinical Pool for follow up.

## 2019-02-23 ENCOUNTER — Encounter (HOSPITAL_COMMUNITY): Payer: Self-pay

## 2019-02-23 ENCOUNTER — Inpatient Hospital Stay (HOSPITAL_COMMUNITY): Payer: BC Managed Care – PPO

## 2019-02-23 ENCOUNTER — Inpatient Hospital Stay (HOSPITAL_COMMUNITY)
Admission: AD | Admit: 2019-02-23 | Discharge: 2019-02-23 | Disposition: A | Discharge status: Home / Self Care | Payer: BC Managed Care – PPO | Attending: Obstetrics and Gynecology | Admitting: Obstetrics and Gynecology

## 2019-02-23 ENCOUNTER — Other Ambulatory Visit: Payer: Self-pay

## 2019-02-23 DIAGNOSIS — O360910 Maternal care for other rhesus isoimmunization, first trimester, not applicable or unspecified: Secondary | ICD-10-CM

## 2019-02-23 DIAGNOSIS — Z87891 Personal history of nicotine dependence: Secondary | ICD-10-CM | POA: Insufficient documentation

## 2019-02-23 DIAGNOSIS — Z3A10 10 weeks gestation of pregnancy: Secondary | ICD-10-CM | POA: Insufficient documentation

## 2019-02-23 DIAGNOSIS — O200 Threatened abortion: Secondary | ICD-10-CM | POA: Diagnosis not present

## 2019-02-23 DIAGNOSIS — Z6790 Unspecified blood type, Rh positive: Secondary | ICD-10-CM

## 2019-02-23 DIAGNOSIS — O209 Hemorrhage in early pregnancy, unspecified: Secondary | ICD-10-CM

## 2019-02-23 LAB — WET PREP, GENITAL
Sperm: NONE SEEN
Trich, Wet Prep: NONE SEEN
WBC, Wet Prep HPF POC: NONE SEEN
Yeast Wet Prep HPF POC: NONE SEEN

## 2019-02-23 LAB — URINALYSIS, ROUTINE W REFLEX MICROSCOPIC
Bilirubin Urine: NEGATIVE
Glucose, UA: NEGATIVE mg/dL
Hgb urine dipstick: NEGATIVE
Ketones, ur: NEGATIVE mg/dL
Leukocytes,Ua: NEGATIVE
Nitrite: NEGATIVE
Protein, ur: NEGATIVE mg/dL
Specific Gravity, Urine: 1.024 (ref 1.005–1.030)
pH: 6 (ref 5.0–8.0)

## 2019-02-23 LAB — CBC
HCT: 32.3 % — ABNORMAL LOW (ref 36.0–46.0)
Hemoglobin: 11.3 g/dL — ABNORMAL LOW (ref 12.0–15.0)
MCH: 28.1 pg (ref 26.0–34.0)
MCHC: 35 g/dL (ref 30.0–36.0)
MCV: 80.3 fL (ref 80.0–100.0)
Platelets: 179 10*3/uL (ref 150–400)
RBC: 4.02 MIL/uL (ref 3.87–5.11)
RDW: 12 % (ref 11.5–15.5)
WBC: 4.1 10*3/uL (ref 4.0–10.5)
nRBC: 0 % (ref 0.0–0.2)

## 2019-02-23 LAB — HCG, QUANTITATIVE, PREGNANCY: hCG, Beta Chain, Quant, S: 19563 m[IU]/mL — ABNORMAL HIGH (ref <5)

## 2019-02-23 NOTE — MAU Provider Note (Signed)
Chief Complaint: Vaginal Bleeding and Abdominal Pain   None     SUBJECTIVE HPI: Michele Becker is a 33 y.o. [phone] at [redacted]w[redacted]d by LMP who presents to maternity admissions reporting intermittent vaginal bleeding that is spotting only, increased today to light period bleeding.  Previous US at pregnancy care center showed 2 possible gestational sacs, fetal pole in only one sac. Follow up US was ordered outpatient for this week at [facility] office.  Pt presents with the increase in bleeding today. She reports mild intermittent sharp lower abdominal pain that does not radiate. It has not required treatment. There are no other associated symptoms.   HPI  Past Medical History:  Diagnosis Date  . Bacterial vaginosis 07/22/2013  . Genital herpes    no outbreaks in years  . Headache(784.0)    Migraines as child  . HSV (herpes simplex virus) anogenital infection   . Peripheral edema   . Sickle cell trait (HCC)   . Trichomonas vaginitis    Past Surgical History:  Procedure Laterality Date  . CESAREAN SECTION     Pt states no fluid around baby  . CESAREAN SECTION N/A 12/08/2013   Procedure: CESAREAN SECTION;  Surgeon: Oliver Pila, MD;  Location: [facility];  Service: Obstetrics;  Laterality: N/A;  . CESAREAN SECTION N/A 03/08/2018   Procedure: CESAREAN SECTION;  Surgeon: Philip Aspen, DO;  Location: [facility] BIRTHING SUITES;  Service: Obstetrics;  Laterality: N/A;  . WISDOM TOOTH EXTRACTION     Social History   Socioeconomic History  . Marital status: Married    Spouse name: Not on file  . Number of children: Not on file  . Years of education: Not on file  . Highest education level: Not on file  Occupational History  . Not on file  Social Needs  . Financial resource strain: Not on file  . Food insecurity:    Worry: Not on file    Inability: Not on file  . Transportation needs:    Medical: Not on file    Non-medical: Not on file  Tobacco Use  . Smoking status: Former Games developer  .  Smokeless tobacco: Never Used  Substance and Sexual Activity  . Alcohol use: Yes    Alcohol/week: 28.0 standard drinks    Types: 14 Cans of beer, 14 Standard drinks or equivalent per week    Comment: occasional with pregnancy  . Drug use: Not Currently    Types: Marijuana    Comment: occasional  . Sexual activity: Yes    Birth control/protection: None  Lifestyle  . Physical activity:    Days per week: Not on file    Minutes per session: Not on file  . Stress: Not on file  Relationships  . Social connections:    Talks on phone: Not on file    Gets together: Not on file    Attends religious service: Not on file    Active member of club or organization: Not on file    Attends meetings of clubs or organizations: Not on file    Relationship status: Not on file  . Intimate partner violence:    Fear of current or ex partner: Not on file    Emotionally abused: Not on file    Physically abused: Not on file    Forced sexual activity: Not on file  Other Topics Concern  . Not on file  Social History Narrative  . Not on file   No current facility-administered medications on file prior  to encounter.    Current Outpatient Medications on File Prior to Encounter  Medication Sig Dispense Refill  . Prenatal Vit-Fe Fumarate-FA (PRENATAL VITAMIN PO) Take 1 tablet by mouth daily.      No Known Allergies  ROS:  Review of Systems  Constitutional: Negative for chills, fatigue and fever.  Respiratory: Negative for shortness of breath.   Cardiovascular: Negative for chest pain.  Gastrointestinal: Positive for abdominal pain.  Genitourinary: Positive for vaginal bleeding. Negative for difficulty urinating, dysuria, flank pain, pelvic pain, vaginal discharge and vaginal pain.  Neurological: Negative for dizziness and headaches.  Psychiatric/Behavioral: Negative.      I have reviewed patient's Past Medical Hx, Surgical Hx, Family Hx, Social Hx, medications and allergies.   Physical Exam    Patient Vitals for the past 24 hrs:  BP Temp Temp src Pulse Resp Height Weight  02/23/19 1307 137/79 - - 79 - - -  02/23/19 1251 124/72 - - 77 18 - -  02/23/19 0936 123/70 - - - - - -  02/23/19 0930 - 98.7 F (37.1 C) Oral 81 18 5\' 7"  (1.702 m) 122.7 kg   Constitutional: Well-developed, well-nourished female in no acute distress.  Cardiovascular: normal rate Respiratory: normal effort GI: Abd soft, non-tender. Pos BS x 4 MS: Extremities nontender, no edema, normal ROM Neurologic: Alert and oriented x 4.  GU: Neg CVAT.  PELVIC EXAM: Cervix pink, visually closed, without lesion, small amount dark red bleeding, vaginal walls and external genitalia normal Bimanual exam: Cervix 0/long/high, firm, anterior, neg CMT, uterus nontender, nonenlarged, adnexa without tenderness, enlargement, or mass   LAB RESULTS Results for orders placed or performed during the hospital encounter of 02/23/19 (from the past 24 hour(s))  Urinalysis, Routine w reflex microscopic     Status: None   Collection Time: 02/23/19  9:40 AM  Result Value Ref Range   Color, Urine YELLOW YELLOW   APPearance CLEAR CLEAR   Specific Gravity, Urine 1.024 1.005 - 1.030   pH 6.0 5.0 - 8.0   Glucose, UA NEGATIVE NEGATIVE mg/dL   Hgb urine dipstick NEGATIVE NEGATIVE   Bilirubin Urine NEGATIVE NEGATIVE   Ketones, ur NEGATIVE NEGATIVE mg/dL   Protein, ur NEGATIVE NEGATIVE mg/dL   Nitrite NEGATIVE NEGATIVE   Leukocytes,Ua NEGATIVE NEGATIVE  CBC     Status: Abnormal   Collection Time: 02/23/19  9:53 AM  Result Value Ref Range   WBC 4.1 4.0 - 10.5 K/uL   RBC 4.02 3.87 - 5.11 MIL/uL   Hemoglobin 11.3 (L) 12.0 - 15.0 g/dL   HCT 40.932.3 (L) 81.136.0 - 91.446.0 %   MCV 80.3 80.0 - 100.0 fL   MCH 28.1 26.0 - 34.0 pg   MCHC 35.0 30.0 - 36.0 g/dL   RDW 78.212.0 95.611.5 - 21.315.5 %   Platelets 179 150 - 400 K/uL   nRBC 0.0 0.0 - 0.2 %  hCG, quantitative, pregnancy     Status: Abnormal   Collection Time: 02/23/19  9:53 AM  Result Value Ref Range    hCG, Beta Chain, Quant, S 19,563 (H) <5 mIU/mL  ABO/Rh     Status: None   Collection Time: 02/23/19  9:53 AM  Result Value Ref Range   ABO/RH(D)      O POS Performed at [facility], [address] [place], [place] [id number]   Wet prep, genital     Status: Abnormal   Collection Time: 02/23/19 12:50 PM  Result Value Ref Range   Yeast Wet  Prep HPF POC NONE SEEN NONE SEEN   Trich, Wet Prep NONE SEEN NONE SEEN   Clue Cells Wet Prep HPF POC PRESENT (A) NONE SEEN   WBC, Wet Prep HPF POC NONE SEEN NONE SEEN   Sperm NONE SEEN     --/--/O POS Performed at [facility], [address] [place], [place] [address]  [phone])  IMAGING US Ob Less Than 14 Weeks With Ob Transvaginal  Result Date: 02/23/2019 CLINICAL DATA:  Early pregnancy, vaginal bleeding. Quantitative beta HCG 19,563. EXAM: OBSTETRIC <14 WK ULTRASOUND TECHNIQUE: Transabdominal ultrasound was performed for evaluation of the gestation as well as the maternal uterus and adnexal regions. COMPARISON:  None. FINDINGS: Intrauterine gestational sac: Present Yolk sac:  Present Embryo:  Present Cardiac Activity: Present Heart Rate: 89 bpm MSD:  27.4 mm   7 w   5 d CRL:   5.84 mm   6 w 2 d                  [place] EDC: 10/24/2019 Subchorionic hemorrhage:  Large Maternal uterus/adnexae: Unremarkable IMPRESSION: 1. Single intrauterine pregnancy measuring at 6 weeks 2 days gestation 2. Large subchorionic hemorrhage. 3. Bradycardia in the embryo, a negative prognostic indicator. Electronically Signed   By: Gaylyn Rong M.D.   On: 02/23/2019 12:22    MAU Management/MDM: Orders Placed This Encounter  Procedures  . Wet prep, genital  . US OB LESS THAN 14 WEEKS WITH OB TRANSVAGINAL  . Urinalysis, Routine w reflex microscopic  . CBC  . hCG, quantitative, pregnancy  . ABO/Rh  . Discharge patient    No orders of the defined types were placed in this encounter.   [place] confirms single IUP but with low FHR.  Discussed poor  prognosis but no definitive diagnosis with pt and questions answered. Pt to keep scheduled outpatient [place] appointment this week for viability [place].  Follow up as indicated by [place].  Return to MAU with heavy bleeding or other emergencies.     ASSESSMENT 1. Threatened miscarriage in early pregnancy   2. Vaginal bleeding in pregnancy, first trimester   3. Blood type, Rh positive     PLAN Discharge home Allergies as of 02/23/2019   No Known Allergies     Medication List    STOP taking these medications   docusate sodium 100 MG capsule Commonly known as:  Colace   ibuprofen 600 MG tablet Commonly known as:  ADVIL   oxyCODONE-acetaminophen 5-325 MG tablet Commonly known as:  PERCOCET/ROXICET     TAKE these medications   PRENATAL VITAMIN PO Take 1 tablet by mouth daily.      Follow-up Information    [facility] Follow up.   Specialty:  Obstetrics and Gynecology Why:  On 02/26/19, as scheduled for ultrasound, or return to MAU as needed for emergencies. Contact information: [address], [address] [id number] [place] [address] [phone]          Sharen Counter Certified Nurse-Midwife 02/23/2019  8:37 PM

## 2019-02-23 NOTE — MAU Note (Signed)
Pt reports vag bleeding since yesterday that has gotten worse today.  Pt reports soaking through underwear around 0900.  Pt reports lower abd cramping

## 2019-02-24 LAB — ABO/RH: ABO/RH(D): O POS

## 2019-02-24 LAB — GC/CHLAMYDIA PROBE AMP (~~LOC~~) NOT AT ARMC
Chlamydia: NEGATIVE
Neisseria Gonorrhea: NEGATIVE

## 2019-02-24 LAB — HIV ANTIBODY (ROUTINE TESTING W REFLEX): HIV Screen 4th Generation wRfx: NONREACTIVE

## 2019-02-26 ENCOUNTER — Other Ambulatory Visit: Payer: Self-pay | Admitting: Obstetrics & Gynecology

## 2019-02-26 ENCOUNTER — Telehealth: Payer: Self-pay | Admitting: Obstetrics & Gynecology

## 2019-02-26 ENCOUNTER — Telehealth: Payer: Self-pay | Admitting: *Deleted

## 2019-02-26 ENCOUNTER — Ambulatory Visit (HOSPITAL_COMMUNITY)
Admission: RE | Admit: 2019-02-26 | Discharge: 2019-02-26 | Disposition: A | Discharge status: Home / Self Care | Payer: Self-pay | Source: Ambulatory Visit | Attending: Obstetrics & Gynecology | Admitting: Obstetrics & Gynecology

## 2019-02-26 ENCOUNTER — Other Ambulatory Visit: Payer: Self-pay

## 2019-02-26 ENCOUNTER — Ambulatory Visit: Payer: Medicaid Other | Admitting: *Deleted

## 2019-02-26 DIAGNOSIS — O021 Missed abortion: Secondary | ICD-10-CM

## 2019-02-26 DIAGNOSIS — O3680X0 Pregnancy with inconclusive fetal viability, not applicable or unspecified: Secondary | ICD-10-CM | POA: Insufficient documentation

## 2019-02-26 DIAGNOSIS — O200 Threatened abortion: Secondary | ICD-10-CM

## 2019-02-26 MED ORDER — IBUPROFEN 800 MG PO TABS: 800.0000 mg | ORAL_TABLET | Freq: Three times a day (TID) | ORAL | 1 refills | Status: DC | PRN

## 2019-02-26 MED ORDER — MISOPROSTOL 200 MCG PO TABS: ORAL_TABLET | ORAL | 0 refills | Status: DC

## 2019-02-26 MED ORDER — FERROUS SULFATE 325 (65 FE) MG PO TABS: 325.0000 mg | ORAL_TABLET | Freq: Every day | ORAL | 3 refills | Status: DC

## 2019-02-26 NOTE — Telephone Encounter (Signed)
Called pt regarding her ultrasound pictures. Pt left office before receiving pictures.  Acknowledged pt's difficult time and apologized for bothering her at this time but wanted to let her know that she had left prior to being given her ultrasound pictures and if she wanted them, they could be mailed today.  Pt stated she did not want them and they could be thrown away.  Verified with pt and she confirmed that she did not want them.

## 2019-02-26 NOTE — Telephone Encounter (Signed)
I gave her the results of the u/s (fetal demise). She opts for cytotec so I prescribed cytotec and IBU and iron. Rec come to clinic in 6 weeks for [facility]. Blood type O+

## 2019-02-26 NOTE — Progress Notes (Signed)
Ultrasound results finalized and went to review with Dr. Marice Potter.  Dr. Marice Potter had already called pt regarding these ultrasound results and pt was not in the lobby when I went to check.

## 2019-02-27 ENCOUNTER — Ambulatory Visit: Payer: Medicaid Other

## 2019-03-04 ENCOUNTER — Telehealth: Payer: Self-pay | Admitting: Family Medicine

## 2019-03-04 NOTE — Telephone Encounter (Signed)
Patient called said she is still bleeding and not sure what to do. Requesting a call back

## 2019-03-06 ENCOUNTER — Telehealth: Payer: Self-pay | Admitting: Obstetrics and Gynecology

## 2019-03-06 DIAGNOSIS — O039 Complete or unspecified spontaneous abortion without complication: Secondary | ICD-10-CM

## 2019-03-06 NOTE — Telephone Encounter (Signed)
The patient called in stating she has miscarriage around Feb 22, 2019. She also stated she hasn't been to work and would like to know how long she has to stay out. She started the medication to pass the remnants on Feb 28, 2019. She noticed she has a lump in her vagina today that is very swollen. It doesn hurt. She stated she would like a call back from the nurse.   -Please send the days she can stay out to the front desk and we will fax it to the employer.

## 2019-03-06 NOTE — Telephone Encounter (Signed)
Returned pt's call and advised her that she can go back to work anytime.  Also advised her that we could schedule an appointment regarding the lump on her vagina.  Pt verbalized understanding and requested the appointment.  Advised pt that a message would be sent to the front office staff to schedule the appointment and write the letter for her to go back to work.  Pt verbalized understanding.

## 2019-03-11 ENCOUNTER — Other Ambulatory Visit: Payer: Self-pay

## 2019-03-11 ENCOUNTER — Telehealth: Payer: Self-pay

## 2019-03-11 ENCOUNTER — Telehealth: Payer: Self-pay | Admitting: Student

## 2019-03-11 ENCOUNTER — Encounter: Payer: Self-pay | Admitting: Student

## 2019-03-11 ENCOUNTER — Ambulatory Visit: Payer: Medicaid Other | Admitting: Obstetrics and Gynecology

## 2019-03-11 DIAGNOSIS — O039 Complete or unspecified spontaneous abortion without complication: Secondary | ICD-10-CM

## 2019-03-11 NOTE — Telephone Encounter (Signed)
Attempted to call patient 3 times all times a recording came on that you have reached a voicemail box that has not been set up yet. Will send mychart message to rescheduled appointment.

## 2019-03-11 NOTE — Telephone Encounter (Signed)
Sending the patient an missed appointment letter. °

## 2019-03-11 NOTE — Progress Notes (Signed)
Patient called 3x per RN with no answer. She will be contacted to reschedule.  Baldemar Lenis, M.D. Attending Center for [employer]wife)

## 2019-03-13 ENCOUNTER — Encounter: Payer: Self-pay | Admitting: Obstetrics & Gynecology

## 2019-03-18 DIAGNOSIS — Z029 Encounter for administrative examinations, unspecified: Secondary | ICD-10-CM

## 2019-03-20 ENCOUNTER — Other Ambulatory Visit: Payer: Self-pay

## 2019-03-20 ENCOUNTER — Encounter (HOSPITAL_BASED_OUTPATIENT_CLINIC_OR_DEPARTMENT_OTHER): Payer: Self-pay | Admitting: *Deleted

## 2019-03-20 ENCOUNTER — Other Ambulatory Visit: Payer: Self-pay | Admitting: Obstetrics and Gynecology

## 2019-03-20 NOTE — Progress Notes (Signed)
SPOKE W/  _ pt via phone     SCREENING SYMPTOMS OF COVID 19:   COUGH-- no  RUNNY NOSE--- no  SORE THROAT--- no  NASAL CONGESTION---- no  SNEEZING---- no  SHORTNESS OF BREATH--- no  DIFFICULTY BREATHING--- no  TEMP >100.0 ----- no  UNEXPLAINED BODY ACHES------ no  CHILLS --------  no  HEADACHES --------- no  LOSS OF SMELL/ TASTE --------  no    HAVE YOU OR ANY FAMILY MEMBER TRAVELLED PAST 14 DAYS OUT OF THE   COUNTY--- yes STATE---- yes COUNTRY---- no  Per pt travelled to [facility], [place] and returned back home today   HAVE YOU OR ANY FAMILY MEMBER BEEN EXPOSED TO ANYONE WITH COVID 19?  denies

## 2019-03-20 NOTE — Progress Notes (Addendum)
Spoke w/ pt via phone for pre-op interview.  Npo after mn. Arrive at 1000.  Needs cbc and t&s.  Pt unable to come for covid test Friday 03-21-2019 for surgery on Monday 03-24-2019.  Will call dr Claiborne Billings office and speak with charge nurse to covid testing team to coordinate for rapid test Monday morning to arrival [place], tomorrow morning Friday 03-20-2019.   ADDENDUM:  Spoke w/ covid testing site nurse ,  [name] to first in line for 0900 testing.  Called and spoke w/ pt via phone informed pt that she will need to be at covid testing site at 0845 for 0900 testing and wait in car until she hears from results , if negative she may go [facility].  Called and lvm for stacie, or scheduler for dr Claiborne Billings, to inform dr Claiborne Billings that pt going for covid test morning of surgery due pt working 7a - 7p Friday thru Sunday.

## 2019-03-24 ENCOUNTER — Other Ambulatory Visit (HOSPITAL_COMMUNITY)
Admission: RE | Admit: 2019-03-24 | Discharge: 2019-03-24 | Disposition: A | Discharge status: Home / Self Care | Payer: HRSA Program | Source: Ambulatory Visit | Attending: Obstetrics and Gynecology | Admitting: Obstetrics and Gynecology

## 2019-03-24 ENCOUNTER — Ambulatory Visit (HOSPITAL_BASED_OUTPATIENT_CLINIC_OR_DEPARTMENT_OTHER): Payer: BC Managed Care – PPO | Admitting: Certified Registered Nurse Anesthetist

## 2019-03-24 ENCOUNTER — Ambulatory Visit (HOSPITAL_BASED_OUTPATIENT_CLINIC_OR_DEPARTMENT_OTHER)
Admission: RE | Admit: 2019-03-24 | Discharge: 2019-03-24 | Disposition: A | Discharge status: Home / Self Care | Payer: BC Managed Care – PPO | Attending: Obstetrics and Gynecology | Admitting: Obstetrics and Gynecology

## 2019-03-24 ENCOUNTER — Other Ambulatory Visit: Payer: Self-pay

## 2019-03-24 ENCOUNTER — Encounter (HOSPITAL_BASED_OUTPATIENT_CLINIC_OR_DEPARTMENT_OTHER): Payer: Self-pay | Admitting: Anesthesiology

## 2019-03-24 ENCOUNTER — Encounter (HOSPITAL_BASED_OUTPATIENT_CLINIC_OR_DEPARTMENT_OTHER)
Admission: RE | Disposition: A | Discharge status: Home / Self Care | Payer: Self-pay | Source: Home / Self Care | Attending: Obstetrics and Gynecology

## 2019-03-24 DIAGNOSIS — Z6841 Body Mass Index (BMI) 40.0 and over, adult: Secondary | ICD-10-CM | POA: Insufficient documentation

## 2019-03-24 DIAGNOSIS — O021 Missed abortion: Secondary | ICD-10-CM | POA: Diagnosis present

## 2019-03-24 DIAGNOSIS — D573 Sickle-cell trait: Secondary | ICD-10-CM | POA: Diagnosis not present

## 2019-03-24 DIAGNOSIS — Z1159 Encounter for screening for other viral diseases: Secondary | ICD-10-CM | POA: Insufficient documentation

## 2019-03-24 DIAGNOSIS — Z3A14 14 weeks gestation of pregnancy: Secondary | ICD-10-CM | POA: Diagnosis not present

## 2019-03-24 HISTORY — PX: DILATION AND EVACUATION: SHX1459

## 2019-03-24 HISTORY — DX: Personal history of other infectious and parasitic diseases: Z86.19

## 2019-03-24 HISTORY — DX: Presence of spectacles and contact lenses: Z97.3

## 2019-03-24 HISTORY — DX: Iron deficiency anemia, unspecified: D50.9

## 2019-03-24 HISTORY — DX: Other complications of anesthesia, initial encounter: T88.59XA

## 2019-03-24 LAB — ABO/RH: ABO/RH(D): O POS

## 2019-03-24 LAB — CBC
HCT: 34.8 % — ABNORMAL LOW (ref 36.0–46.0)
Hemoglobin: 12 g/dL (ref 12.0–15.0)
MCH: 28.8 pg (ref 26.0–34.0)
MCHC: 34.5 g/dL (ref 30.0–36.0)
MCV: 83.5 fL (ref 80.0–100.0)
Platelets: 176 10*3/uL (ref 150–400)
RBC: 4.17 MIL/uL (ref 3.87–5.11)
RDW: 13 % (ref 11.5–15.5)
WBC: 4.8 10*3/uL (ref 4.0–10.5)
nRBC: 0 % (ref 0.0–0.2)

## 2019-03-24 LAB — TYPE AND SCREEN
ABO/RH(D): O POS
Antibody Screen: NEGATIVE

## 2019-03-24 LAB — SARS CORONAVIRUS 2 BY RT PCR (HOSPITAL ORDER, PERFORMED IN ~~LOC~~ HOSPITAL LAB): SARS Coronavirus 2: NEGATIVE

## 2019-03-24 SURGERY — DILATION AND EVACUATION, UTERUS
Anesthesia: General | Site: Vagina

## 2019-03-24 MED ORDER — KETOROLAC TROMETHAMINE 30 MG/ML IJ SOLN
INTRAMUSCULAR | Status: AC
  Filled 2019-03-24: qty 1

## 2019-03-24 MED ORDER — MIDAZOLAM HCL 2 MG/2ML IJ SOLN
INTRAMUSCULAR | Status: AC
  Filled 2019-03-24: qty 2

## 2019-03-24 MED ORDER — ONDANSETRON HCL 4 MG/2ML IJ SOLN
4.0000 mg | Freq: Once | INTRAMUSCULAR | Status: DC | PRN
  Filled 2019-03-24: qty 2

## 2019-03-24 MED ORDER — OXYCODONE-ACETAMINOPHEN 5-325 MG PO TABS
1.0000 | ORAL_TABLET | Freq: Four times a day (QID) | ORAL | Status: DC | PRN
  Filled 2019-03-24: qty 2

## 2019-03-24 MED ORDER — LIDOCAINE HCL 1 % IJ SOLN
INTRAMUSCULAR | Status: DC | PRN
  Administered 2019-03-24: 10 mL

## 2019-03-24 MED ORDER — OXYCODONE-ACETAMINOPHEN 5-325 MG PO TABS: 1.0000 | ORAL_TABLET | Freq: Four times a day (QID) | ORAL | 0 refills | Status: DC | PRN

## 2019-03-24 MED ORDER — PROPOFOL 10 MG/ML IV BOLUS
INTRAVENOUS | Status: AC
  Filled 2019-03-24: qty 20

## 2019-03-24 MED ORDER — FENTANYL CITRATE (PF) 100 MCG/2ML IJ SOLN
INTRAMUSCULAR | Status: AC
  Filled 2019-03-24: qty 2

## 2019-03-24 MED ORDER — OXYCODONE HCL 5 MG PO TABS
5.0000 mg | ORAL_TABLET | Freq: Once | ORAL | Status: DC | PRN
  Filled 2019-03-24: qty 1

## 2019-03-24 MED ORDER — PROPOFOL 10 MG/ML IV BOLUS
INTRAVENOUS | Status: DC | PRN
  Administered 2019-03-24: 200 mg via INTRAVENOUS

## 2019-03-24 MED ORDER — KETOROLAC TROMETHAMINE 30 MG/ML IJ SOLN
INTRAMUSCULAR | Status: DC | PRN
  Administered 2019-03-24: 30 mg via INTRAVENOUS

## 2019-03-24 MED ORDER — LIDOCAINE 2% (20 MG/ML) 5 ML SYRINGE
INTRAMUSCULAR | Status: AC
  Filled 2019-03-24: qty 5

## 2019-03-24 MED ORDER — ONDANSETRON HCL 4 MG/2ML IJ SOLN
INTRAMUSCULAR | Status: AC
  Filled 2019-03-24: qty 2

## 2019-03-24 MED ORDER — DEXAMETHASONE SODIUM PHOSPHATE 10 MG/ML IJ SOLN
INTRAMUSCULAR | Status: AC
  Filled 2019-03-24: qty 1

## 2019-03-24 MED ORDER — KETOROLAC TROMETHAMINE 30 MG/ML IJ SOLN
30.0000 mg | Freq: Once | INTRAMUSCULAR | Status: DC | PRN
  Filled 2019-03-24: qty 1

## 2019-03-24 MED ORDER — LIDOCAINE HCL (CARDIAC) PF 100 MG/5ML IV SOSY
PREFILLED_SYRINGE | INTRAVENOUS | Status: DC | PRN
  Administered 2019-03-24: 100 mg via INTRAVENOUS

## 2019-03-24 MED ORDER — ACETAMINOPHEN 500 MG PO TABS
1000.0000 mg | ORAL_TABLET | Freq: Once | ORAL | Status: AC
  Administered 2019-03-24: 11:00:00 1000 mg via ORAL
  Filled 2019-03-24: qty 2

## 2019-03-24 MED ORDER — MEPERIDINE HCL 25 MG/ML IJ SOLN
6.2500 mg | INTRAMUSCULAR | Status: DC | PRN
  Filled 2019-03-24: qty 1

## 2019-03-24 MED ORDER — ONDANSETRON HCL 4 MG/2ML IJ SOLN
INTRAMUSCULAR | Status: DC | PRN
  Administered 2019-03-24: 4 mg via INTRAVENOUS

## 2019-03-24 MED ORDER — DOXYCYCLINE HYCLATE 50 MG PO CAPS: 100.0000 mg | ORAL_CAPSULE | Freq: Once | ORAL | 0 refills | Status: AC

## 2019-03-24 MED ORDER — ACETAMINOPHEN 500 MG PO TABS
ORAL_TABLET | ORAL | Status: AC
  Filled 2019-03-24: qty 2

## 2019-03-24 MED ORDER — FENTANYL CITRATE (PF) 100 MCG/2ML IJ SOLN
25.0000 ug | INTRAMUSCULAR | Status: DC | PRN
  Filled 2019-03-24: qty 1

## 2019-03-24 MED ORDER — OXYCODONE HCL 5 MG/5ML PO SOLN
5.0000 mg | Freq: Once | ORAL | Status: DC | PRN
  Filled 2019-03-24: qty 5

## 2019-03-24 MED ORDER — ACETAMINOPHEN 160 MG/5ML PO SOLN
325.0000 mg | ORAL | Status: DC | PRN
  Filled 2019-03-24: qty 20.3

## 2019-03-24 MED ORDER — MIDAZOLAM HCL 5 MG/5ML IJ SOLN
INTRAMUSCULAR | Status: DC | PRN
  Administered 2019-03-24: 2 mg via INTRAVENOUS

## 2019-03-24 MED ORDER — DEXAMETHASONE SODIUM PHOSPHATE 4 MG/ML IJ SOLN
INTRAMUSCULAR | Status: DC | PRN
  Administered 2019-03-24: 10 mg via INTRAVENOUS

## 2019-03-24 MED ORDER — FENTANYL CITRATE (PF) 100 MCG/2ML IJ SOLN
INTRAMUSCULAR | Status: DC | PRN
  Administered 2019-03-24: 50 ug via INTRAVENOUS
  Administered 2019-03-24: 25 ug via INTRAVENOUS
  Administered 2019-03-24: 50 ug via INTRAVENOUS
  Administered 2019-03-24: 25 ug via INTRAVENOUS
  Administered 2019-03-24: 50 ug via INTRAVENOUS

## 2019-03-24 MED ORDER — SODIUM CHLORIDE 0.9 % IV SOLN
100.0000 mg | Freq: Once | INTRAVENOUS | Status: AC
  Administered 2019-03-24: 100 mg via INTRAVENOUS
  Filled 2019-03-24 (×2): qty 100

## 2019-03-24 MED ORDER — ACETAMINOPHEN 325 MG PO TABS
325.0000 mg | ORAL_TABLET | ORAL | Status: DC | PRN
  Filled 2019-03-24: qty 2

## 2019-03-24 MED ORDER — LACTATED RINGERS IV SOLN
INTRAVENOUS | Status: DC
  Administered 2019-03-24: 11:00:00 via INTRAVENOUS
  Filled 2019-03-24: qty 1000

## 2019-03-24 SURGICAL SUPPLY — 26 items
CATH ROBINSON RED A/P 16FR (CATHETERS) ×3 IMPLANT
CLOTH BEACON ORANGE TIMEOUT ST (SAFETY) ×3 IMPLANT
COVER WAND RF STERILE (DRAPES) ×3 IMPLANT
DECANTER SPIKE VIAL GLASS SM (MISCELLANEOUS) ×3 IMPLANT
ELECT REM PT RETURN 9FT ADLT (ELECTROSURGICAL) ×3
ELECTRODE REM PT RTRN 9FT ADLT (ELECTROSURGICAL) ×1 IMPLANT
FILTER UTR ASPR ASSEMBLY (MISCELLANEOUS) ×3 IMPLANT
GLOVE BIO SURGEON STRL SZ 6.5 (GLOVE) ×1 IMPLANT
GLOVE BIO SURGEONS STRL SZ 6.5 (GLOVE) ×1
GLOVE BIOGEL PI IND STRL 6.5 (GLOVE) ×1 IMPLANT
GLOVE BIOGEL PI IND STRL 7.0 (GLOVE) ×1 IMPLANT
GLOVE BIOGEL PI INDICATOR 6.5 (GLOVE) ×4
GLOVE BIOGEL PI INDICATOR 7.0 (GLOVE) ×2
GLOVE ECLIPSE 6.5 STRL STRAW (GLOVE) ×3 IMPLANT
GOWN STRL REUS W/TWL LRG LVL3 (GOWN DISPOSABLE) ×6 IMPLANT
KIT BERKELEY 1ST TRIMESTER 3/8 (MISCELLANEOUS) ×3 IMPLANT
NS IRRIG 500ML POUR BTL (IV SOLUTION) ×3 IMPLANT
PACK VAGINAL MINOR WOMEN LF (CUSTOM PROCEDURE TRAY) ×3 IMPLANT
PAD OB MATERNITY 4.3X12.25 (PERSONAL CARE ITEMS) ×3 IMPLANT
PAD PREP 24X48 CUFFED NSTRL (MISCELLANEOUS) ×3 IMPLANT
SET BERKELEY SUCTION TUBING (SUCTIONS) ×3 IMPLANT
TOWEL OR 17X26 10 PK STRL BLUE (TOWEL DISPOSABLE) ×6 IMPLANT
VACURETTE 10 RIGID CVD (CANNULA) IMPLANT
VACURETTE 7MM CVD STRL WRAP (CANNULA) IMPLANT
VACURETTE 8 RIGID CVD (CANNULA) IMPLANT
VACURETTE 9 RIGID CVD (CANNULA) ×2 IMPLANT

## 2019-03-24 NOTE — Op Note (Signed)
03/24/2019  12:24 PM  PATIENT:  Michele Becker  33 y.o. female  PRE-OPERATIVE DIAGNOSIS:  MAB 1st trimester  POST-OPERATIVE DIAGNOSIS:  MAB 1st trimester  PROCEDURE:  Procedure(s): DILATATION AND EVACUATION (N/A)  SURGEON:  Surgeon(s) and Role:    Rogue Bussing, Casimer Bilis, DO - Primary  ANESTHESIA:   local and general with LMA EBL:  100 mL   LOCAL MEDICATIONS USED:  LIDOCAINE  and Amount: 10 ml  SPECIMEN:  Source of Specimen:  RPOC  DISPOSITION OF SPECIMEN:  PATHOLOGY   FINDINGS: approx 10-12 week size AV uterus COUNTS:  YES  PLAN OF CARE: Discharge to home after PACU  PATIENT DISPOSITION:  PACU - hemodynamically stable.   Delay start of Pharmacological VTE agent (>24hrs) due to surgical blood loss or risk of bleeding: not applicable   DESCRIPTION of PROCEDURE:  Pt was taken to the operating room and anesthesia was administered and found to be adequate.  She was prepped and draped in the normal sterile fashion in dorsal lithotomy position and bladder emptied.  A speculum was placed and cervix visualized.  A single tooth tenaculum grasped the anterior lip of the cervix.  The cervical os was gently dilated to 30 pratt.  A 9 curved suction currette was advanced gently to the fundus (10 not available).  The hose was connected and vacuum aspiration performed in two rounds followed by gentle curettage of all for quadrants.  The suction curette was used for one additional pass.  Tenaculum removed and excellent hemostais observed. All instruments were removed from the vagina.  Pt tolerated the procedure well.

## 2019-03-24 NOTE — Anesthesia Preprocedure Evaluation (Addendum)
Anesthesia Evaluation  Patient identified by MRN, date of birth, ID band Patient awake    Reviewed: Allergy & Precautions, NPO status , Patient's Chart, lab work & pertinent test results  History of Anesthesia Complications (+) PONV and history of anesthetic complications  Airway Mallampati: II       Dental no notable dental hx. (+) Teeth Intact   Pulmonary neg pulmonary ROS,    Pulmonary exam normal breath sounds clear to auscultation       Cardiovascular negative cardio ROS Normal cardiovascular exam Rhythm:Regular Rate:Normal     Neuro/Psych negative neurological ROS  negative psych ROS   GI/Hepatic negative GI ROS, Neg liver ROS,   Endo/Other  Morbid obesity  Renal/GU negative Renal ROS     Musculoskeletal negative musculoskeletal ROS (+)   Abdominal (+) + obese,   Peds  Hematology  (+) Sickle cell trait ,   Anesthesia Other Findings MAB  Reproductive/Obstetrics                            Anesthesia Physical Anesthesia Plan  ASA: III  Anesthesia Plan: General   Post-op Pain Management:    Induction: Intravenous  PONV Risk Score and Plan: 4 or greater and Midazolam, Dexamethasone, Ondansetron and Treatment may vary due to age or medical condition  Airway Management Planned: LMA  Additional Equipment:   Intra-op Plan:   Post-operative Plan: Extubation in OR  Informed Consent: I have reviewed the patients History and Physical, chart, labs and discussed the procedure including the risks, benefits and alternatives for the proposed anesthesia with the patient or authorized representative who has indicated his/her understanding and acceptance.     Dental advisory given  Plan Discussed with: CRNA  Anesthesia Plan Comments:         Anesthesia Quick Evaluation

## 2019-03-24 NOTE — H&P (Signed)
33 y.o. [id number] presents for scheduled D&E for missed AB.  Seen in ER- dx with threatened Ab, then was called later and told it was a missed Ab (pt was upset and confused as to why they saw no heart beat after they initially said they did). They Rx'd cytotec, pt had heavy bleeding that tapered. On f/u scan in office FP and YS no longer seen, but irregular GS seen. Will repeat course of cytotec 87mcg and have pt f/u next week for repeat scan. Pt is O+. Still with persistent RPOC (anechoic structure measuring 4x3cm), will set up for D&C.  Past Medical History:  Diagnosis Date  . Complication of anesthesia    woke up during anesthesia with wisdom teeth extraction  . Genital herpes    no outbreaks in years  . History of trichomonal vaginitis   . Iron deficiency anemia   . Sickle cell trait ([place])   . Wears glasses    Past Surgical History:  Procedure Laterality Date  . CESAREAN SECTION N/A 12/08/2013   Procedure: CESAREAN SECTION;  Surgeon: Logan Bores, MD;  Location: [facility];  Service: Obstetrics;  Laterality: N/A;  . CESAREAN SECTION N/A 03/08/2018   Procedure: CESAREAN SECTION;  Surgeon: Allyn Kenner, DO;  Location: [facility];  Service: Obstetrics;  Laterality: N/A;  . CESAREAN SECTION  2011  . WISDOM TOOTH EXTRACTION  teen    Social History   Socioeconomic History  . Marital status: Married    Spouse name: Not on file  . Number of children: Not on file  . Years of education: Not on file  . Highest education level: Not on file  Occupational History  . Not on file  Social Needs  . Financial resource strain: Not on file  . Food insecurity:    Worry: Not on file    Inability: Not on file  . Transportation needs:    Medical: Not on file    Non-medical: Not on file  Tobacco Use  . Smoking status: Never Smoker  . Smokeless tobacco: Never Used  Substance and Sexual Activity  . Alcohol use: Yes    Alcohol/week: 7.0 standard drinks    Types: 7 Standard drinks or  equivalent per week    Comment: occasional with pregnancy,  (03-20-2019 1 drink per day  . Drug use: Not Currently    Types: Marijuana    Comment: occasional  . Sexual activity: Yes    Birth control/protection: None  Lifestyle  . Physical activity:    Days per week: Not on file    Minutes per session: Not on file  . Stress: Not on file  Relationships  . Social connections:    Talks on phone: Not on file    Gets together: Not on file    Attends religious service: Not on file    Active member of club or organization: Not on file    Attends meetings of clubs or organizations: Not on file    Relationship status: Not on file  . Intimate partner violence:    Fear of current or ex partner: Not on file    Emotionally abused: Not on file    Physically abused: Not on file    Forced sexual activity: Not on file  Other Topics Concern  . Not on file  Social History Narrative  . Not on file    No current facility-administered medications on file prior to encounter.    Current Outpatient Medications on File Prior to Encounter  Medication Sig Dispense Refill  . ferrous sulfate 325 (65 FE) MG tablet Take 1 tablet (325 mg total) by mouth daily with breakfast. 60 tablet 3  . misoprostol (CYTOTEC) 200 MCG tablet Place 4 tablets at bedtime once. 4 tablet 0  . Prenatal Vit-Fe Fumarate-FA (PRENATAL VITAMIN PO) Take 1 tablet by mouth daily.     Marland Kitchen. ibuprofen (ADVIL) 800 MG tablet Take 1 tablet (800 mg total) by mouth every 8 (eight) hours as needed. 60 tablet 1    No Known Allergies  Vitals:   03/20/19 1846 03/24/19 1024  BP:  (!) 141/86  Pulse:  79  Resp:  16  Temp:  98.3 F (36.8 C)  TempSrc:  Oral  SpO2:  100%  Weight: 119.7 kg 119.3 kg  Height: 5\' 7"  (1.702 m) 5\' 7"  (1.702 m)    Lungs: clear to ascultation Cor:  RRR Abdomen:  soft, nontender, nondistended. Ex:  no cords, erythema Pelvic:  Deferred to OR  A:  Admit for D&E   P: P: All risks, benefits and alternatives d/w  patient and she desires to proceed.   100mg  IV doxycycline, wit hadd'l Rx for dose at home 12 hrs postop Advised 800mg  ibuprofen for pain post op, add'l Percocet Rx if needed Routine pre-op care  Philip AspenSidney Jadia Capers

## 2019-03-24 NOTE — Anesthesia Procedure Notes (Signed)
Procedure Name: LMA Insertion Date/Time: 03/24/2019 11:56 AM Performed by: Justice Rocher, CRNA Pre-anesthesia Checklist: Patient identified, Emergency Drugs available, Suction available and Patient being monitored Patient Re-evaluated:Patient Re-evaluated prior to induction Oxygen Delivery Method: Circle system utilized Preoxygenation: Pre-oxygenation with 100% oxygen Induction Type: IV induction Ventilation: Mask ventilation without difficulty LMA: LMA inserted LMA Size: 4.0 Number of attempts: 1 Airway Equipment and Method: Bite block Placement Confirmation: positive ETCO2 and breath sounds checked- equal and bilateral Tube secured with: Tape Dental Injury: Teeth and Oropharynx as per pre-operative assessment

## 2019-03-24 NOTE — Transfer of Care (Signed)
Immediate Anesthesia Transfer of Care Note  Patient: Michele Becker  Procedure(s) Performed: Procedure(s) (LRB): DILATATION AND EVACUATION (N/A)  Patient Location: PACU  Anesthesia Type: General  Level of Consciousness: awake, sedated, patient cooperative and responds to stimulation  Airway & Oxygen Therapy: Patient Spontanous Breathing and Patient connected to [place] O2  With soft mask   Post-op Assessment: Report given to PACU RN, Post -op Vital signs reviewed and stable and Patient moving all extremities  Post vital signs: Reviewed and stable  Complications: No apparent anesthesia complications

## 2019-03-24 NOTE — Discharge Instructions (Signed)
  Post Anesthesia Home Care Instructions  Activity: Get plenty of rest for the remainder of the day. A responsible individual must stay with you for 24 hours following the procedure.  For the next 24 hours, DO NOT: -Drive a car -Operate machinery -Drink alcoholic beverages -Take any medication unless instructed by your physician -Make any legal decisions or sign important papers.  Meals: Start with liquid foods such as gelatin or soup. Progress to regular foods as tolerated. Avoid greasy, spicy, heavy foods. If nausea and/or vomiting occur, drink only clear liquids until the nausea and/or vomiting subsides. Call your physician if vomiting continues.  Special Instructions/Symptoms: Your throat may feel dry or sore from the anesthesia or the breathing tube placed in your throat during surgery. If this causes discomfort, gargle with warm salt water. The discomfort should disappear within 24 hours.  If you had a scopolamine patch placed behind your ear for the management of post- operative nausea and/or vomiting:  1. The medication in the patch is effective for 72 hours, after which it should be removed.  Wrap patch in a tissue and discard in the trash. Wash hands thoroughly with soap and water. 2. You may remove the patch earlier than 72 hours if you experience unpleasant side effects which may include dry mouth, dizziness or visual disturbances. 3. Avoid touching the patch. Wash your hands with soap and water after contact with the patch.        DISCHARGE INSTRUCTIONS: D&C / D&E The following instructions have been prepared to help you care for yourself upon your return home.   Personal hygiene:  Use sanitary pads for vaginal drainage, not tampons.  Shower the day after your procedure.  NO tub baths, pools or Jacuzzis for 2-3 weeks.  Wipe front to back after using the bathroom.  Activity and limitations:  Do NOT drive or operate any equipment for 24 hours. The effects of anesthesia  are still present and drowsiness may result.  Do NOT rest in bed all day.  Walking is encouraged.  Walk up and down stairs slowly.  You may resume your normal activity in one to two days or as indicated by your physician.  Sexual activity: NO intercourse for at least 2 weeks after the procedure, or as indicated by your physician.  Diet: Eat a light meal as desired this evening. You may resume your usual diet tomorrow.  Return to work: You may resume your work activities in one to two days or as indicated by your doctor.  What to expect after your surgery: Expect to have vaginal bleeding/discharge for 2-3 days and spotting for up to 10 days. It is not unusual to have soreness for up to 1-2 weeks. You may have a slight burning sensation when you urinate for the first day. Mild cramps may continue for a couple of days. You may have a regular period in 2-6 weeks.  Call your doctor for any of the following:  Excessive vaginal bleeding, saturating and changing one pad every hour.  Inability to urinate 6 hours after discharge from hospital.  Pain not relieved by pain medication.  Fever of 100.4 F or greater.  Unusual vaginal discharge or odor.       

## 2019-03-24 NOTE — Anesthesia Postprocedure Evaluation (Signed)
Anesthesia Post Note  Patient: PAMILA MENDIBLES  Procedure(s) Performed: DILATATION AND EVACUATION (N/A Vagina )     Patient location during evaluation: Phase II Anesthesia Type: General Level of consciousness: sedated Pain management: pain level controlled Vital Signs Assessment: post-procedure vital signs reviewed and stable Respiratory status: spontaneous breathing Cardiovascular status: stable Postop Assessment: no apparent nausea or vomiting Anesthetic complications: no    Last Vitals:  Vitals:   03/24/19 1300 03/24/19 1325  BP: 131/86 (!) 128/48  Pulse: 81 76  Resp: 16 14  Temp:  36.8 C  SpO2: 96% 100%    Last Pain:  Vitals:   03/24/19 1245  TempSrc:   PainSc: 0-No pain   Pain Goal: Patients Stated Pain Goal: 4 (03/24/19 1024)                 Huston Foley

## 2019-03-25 ENCOUNTER — Encounter (HOSPITAL_BASED_OUTPATIENT_CLINIC_OR_DEPARTMENT_OTHER): Payer: Self-pay | Admitting: Obstetrics and Gynecology

## 2019-03-31 ENCOUNTER — Telehealth: Payer: Self-pay | Admitting: Obstetrics & Gynecology

## 2019-03-31 NOTE — Telephone Encounter (Signed)
Called patient to go over her appointment, and she stated she was going to her own doctor.

## 2019-04-01 ENCOUNTER — Ambulatory Visit: Payer: Medicaid Other | Admitting: Obstetrics and Gynecology

## 2019-04-04 ENCOUNTER — Telehealth: Payer: Self-pay | Admitting: Family Medicine

## 2019-04-04 NOTE — Telephone Encounter (Signed)
Spoke with patient to inform her that her FMLA paperwork has been completed and faxed over. Patient verbalized understanding and had no further questions.

## 2019-08-14 ENCOUNTER — Emergency Department (HOSPITAL_COMMUNITY): Payer: BC Managed Care – PPO

## 2019-08-14 ENCOUNTER — Encounter (HOSPITAL_COMMUNITY): Payer: Self-pay

## 2019-08-14 ENCOUNTER — Emergency Department (HOSPITAL_COMMUNITY)
Admission: EM | Admit: 2019-08-14 | Discharge: 2019-08-15 | Disposition: A | Discharge status: Home / Self Care | Payer: BC Managed Care – PPO | Attending: Emergency Medicine | Admitting: Emergency Medicine

## 2019-08-14 ENCOUNTER — Other Ambulatory Visit: Payer: Self-pay

## 2019-08-14 DIAGNOSIS — M546 Pain in thoracic spine: Secondary | ICD-10-CM | POA: Diagnosis present

## 2019-08-14 DIAGNOSIS — M62830 Muscle spasm of back: Secondary | ICD-10-CM | POA: Diagnosis not present

## 2019-08-14 LAB — BASIC METABOLIC PANEL
Anion gap: 10 (ref 5–15)
BUN: 8 mg/dL (ref 6–20)
CO2: 24 mmol/L (ref 22–32)
Calcium: 9.2 mg/dL (ref 8.9–10.3)
Chloride: 104 mmol/L (ref 98–111)
Creatinine, Ser: 0.78 mg/dL (ref 0.44–1.00)
GFR calc Af Amer: >60 mL/min (ref >60)
GFR calc non Af Amer: >60 mL/min (ref >60)
Glucose, Bld: 89 mg/dL (ref 70–99)
Potassium: 3.5 mmol/L (ref 3.5–5.1)
Sodium: 138 mmol/L (ref 135–145)

## 2019-08-14 LAB — CBC
HCT: 35 % — ABNORMAL LOW (ref 36.0–46.0)
Hemoglobin: 12.3 g/dL (ref 12.0–15.0)
MCH: 28.5 pg (ref 26.0–34.0)
MCHC: 35.1 g/dL (ref 30.0–36.0)
MCV: 81 fL (ref 80.0–100.0)
Platelets: 200 10*3/uL (ref 150–400)
RBC: 4.32 MIL/uL (ref 3.87–5.11)
RDW: 12.8 % (ref 11.5–15.5)
WBC: 6 10*3/uL (ref 4.0–10.5)
nRBC: 0 % (ref 0.0–0.2)

## 2019-08-14 NOTE — ED Triage Notes (Signed)
Pt reports SOB and upper back pain for the past two days. Pt denies chest pain but back pain increases with inspiration. Resp e.u, nad noted

## 2019-08-15 LAB — I-STAT BETA HCG BLOOD, ED (MC, WL, AP ONLY): I-stat hCG, quantitative: <5 m[IU]/mL (ref <5)

## 2019-08-15 MED ORDER — CYCLOBENZAPRINE HCL 10 MG PO TABS: 5.0000 mg | ORAL_TABLET | Freq: Every day | ORAL | 0 refills | Status: AC

## 2019-08-15 MED ORDER — KETOROLAC TROMETHAMINE 60 MG/2ML IM SOLN
30.0000 mg | Freq: Once | INTRAMUSCULAR | Status: AC
  Administered 2019-08-15: 30 mg via INTRAMUSCULAR
  Filled 2019-08-15: qty 2

## 2019-08-15 NOTE — ED Provider Notes (Signed)
[facility] EMERGENCY DEPARTMENT Provider Note  CSN: [id number] Arrival date & time: 08/14/19 2238  Chief Complaint(s) Shortness of Breath and Back Pain  HPI Michele Becker is a 33 y.o. female who presents to the emergency department with 2 days of gradually worsening bilateral upper/parascapular back pain that worsened while at work this evening.  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  Pain is worse with deep breathing, movement and palpation of the parascapular musculature.  No other alleviating or aggravating factors.  No falls or trauma.  No chest pain.  No overt shortness of breath.  No other physical complaints.  HPI  Past Medical History Past Medical History:  Diagnosis Date  . Complication of anesthesia    woke up during anesthesia with wisdom teeth extraction  . Genital herpes    no outbreaks in years  . History of trichomonal vaginitis   . Iron deficiency anemia   . Sickle cell trait (HCC)   . Wears glasses    Patient Active Problem List   Diagnosis Date Noted  . Cesarean delivery delivered 03/08/2018  . S/P repeat low transverse C-section 12/09/2013   Home Medication(s) Prior to Admission medications   Medication Sig Start Date End Date Taking? Authorizing Provider  cyclobenzaprine (FLEXERIL) 10 MG tablet Take 0.5-1 tablets (5-10 mg total) by mouth at bedtime for 10 days. 08/15/19 08/25/19  Nira Conn, MD  ferrous sulfate 325 (65 FE) MG tablet Take 1 tablet (325 mg total) by mouth daily with breakfast. Patient not taking: Reported on 08/15/2019 02/26/19   Allie Bossier, MD  ibuprofen (ADVIL) 800 MG tablet Take 1 tablet (800 mg total) by mouth every 8 (eight) hours as needed. Patient not taking: Reported on 08/15/2019 02/26/19   Allie Bossier, MD  oxyCODONE-acetaminophen (PERCOCET/ROXICET) 5-325 MG tablet Take 1-2 tablets by mouth every 6 (six) hours as needed for severe pain. Patient not taking:  Reported on 08/15/2019 03/24/19   Philip Aspen, DO                                                                                                                                    Past Surgical History Past Surgical History:  Procedure Laterality Date  . CESAREAN SECTION N/A 12/08/2013   Procedure: CESAREAN SECTION;  Surgeon: Oliver Pila, MD;  Location: [facility];  Service: Obstetrics;  Laterality: N/A;  . CESAREAN SECTION N/A 03/08/2018   Procedure: CESAREAN SECTION;  Surgeon: Philip Aspen, DO;  Location: [facility] BIRTHING SUITES;  Service: Obstetrics;  Laterality: N/A;  . CESAREAN SECTION  2011  . DILATION AND EVACUATION N/A 03/24/2019   Procedure: DILATATION AND EVACUATION;  Surgeon: Philip Aspen, DO;  Location: [facility];  Service: Gynecology;  Laterality: N/A;  . WISDOM TOOTH EXTRACTION  teen   Family History Family History  Problem Relation Age of Onset  . Hypertension  Other     Social History Social History   Tobacco Use  . Smoking status: Never Smoker  . Smokeless tobacco: Never Used  Substance Use Topics  . Alcohol use: Yes    Alcohol/week: 7.0 standard drinks    Types: 7 Standard drinks or equivalent per week    Comment: occasional with pregnancy,  (03-20-2019 1 drink per day  . Drug use: Not Currently    Types: Marijuana    Comment: occasional   Allergies Patient has no known allergies.  Review of Systems Review of Systems All other systems are reviewed and are negative for acute change except as noted in the HPI  Physical Exam Vital Signs  I have reviewed the triage vital signs BP (!) 144/97   Pulse 81   Temp 99.4 F (37.4 C) (Oral)   Resp 18   Ht 5\' 7"  (1.702 m)   Wt 120.2 kg   LMP 07/23/2019   SpO2 98%   Breastfeeding Unknown   BMI 41.50 kg/m   Physical Exam Vitals signs reviewed.  Constitutional:      General: She is not in acute distress.    Appearance: She is well-developed. She is not diaphoretic.  HENT:      Head: Normocephalic and atraumatic.     Nose: Nose normal.  Eyes:     General: No scleral icterus.       Right eye: No discharge.        Left eye: No discharge.     Conjunctiva/sclera: Conjunctivae normal.     Pupils: Pupils are equal, round, and reactive to light.  Neck:     Musculoskeletal: Normal range of motion and neck supple.  Cardiovascular:     Rate and Rhythm: Normal rate and regular rhythm.     Heart sounds: No murmur. No friction rub. No gallop.   Pulmonary:     Effort: Pulmonary effort is normal. No respiratory distress.     Breath sounds: Normal breath sounds. No stridor. No rales.  Abdominal:     General: There is no distension.     Palpations: Abdomen is soft.     Tenderness: There is no abdominal tenderness.  Musculoskeletal:     Cervical back: She exhibits tenderness and spasm. She exhibits no bony tenderness.       Back:  Skin:    General: Skin is warm and dry.     Findings: No erythema or rash.  Neurological:     Mental Status: She is alert and oriented to person, place, and time.     ED Results and Treatments Labs (all labs ordered are listed, but only abnormal results are displayed) Labs Reviewed  CBC - Abnormal; Notable for the following components:      Result Value   HCT 35.0 (*)    All other components within normal limits  BASIC METABOLIC PANEL  I-STAT BETA HCG BLOOD, ED ([facility], [facility], [facility] ONLY)  EKG  EKG Interpretation  Date/Time:  Thursday August 14 2019 22:53:00 EDT Ventricular Rate:  88 PR Interval:  156 QRS Duration: 76 QT Interval:  360 QTC Calculation: 435 R Axis:   68 Text Interpretation: Normal sinus rhythm with sinus arrhythmia Normal ECG No old tracing to compare Confirmed by Dione BoozeGlick, David ([id number]) on 08/14/2019 11:34:22 PM      Radiology Dg Chest 2 View  Result Date: 08/14/2019 CLINICAL DATA:  33 year old  female with shortness of breath. EXAM: CHEST - 2 VIEW COMPARISON:  Chest radiograph dated 12/26/2016 FINDINGS: The heart size and mediastinal contours are within normal limits. Both lungs are clear. The visualized skeletal structures are unremarkable. IMPRESSION: No active cardiopulmonary disease. Electronically Signed   By: Elgie CollardArash  Radparvar M.D.   On: 08/14/2019 23:25    Pertinent labs & imaging results that were available during my care of the patient were reviewed by me and considered in my medical decision making (see chart for details).  Medications Ordered in ED Medications  ketorolac (TORADOL) injection 30 mg (has no administration in time range)                                                                                                                                    Procedures Procedures  (including critical care time)  Medical Decision Making / ED Course I have reviewed the nursing notes for this encounter and the patient's prior records (if available in EHR or on provided paperwork).   Michele Becker was evaluated in Emergency Department on 08/15/2019 for the symptoms described in the history of present illness. She was evaluated in the context of the global COVID-19 pandemic, which necessitated consideration that the patient might be at risk for infection with the SARS-CoV-2 virus that causes COVID-19. Institutional protocols and algorithms that pertain to the evaluation of patients at risk for COVID-19 are in a state of rapid change based on information released by regulatory bodies including the CDC and federal and state organizations. These policies and algorithms were followed during the patient's care in the ED.  Upper back muscle spasm. toradol IM  Doubt cardiac, PE, or dissection.  Chest x-ray without evidence suggestive of pneumonia, pneumothorax, pneumomediastinum.  No abnormal contour of the mediastinum to suggest dissection. No evidence of acute injuries.   Labs reassuring.  The patient appears reasonably screened and/or stabilized for discharge and I doubt any other medical condition or other [facility] requiring further screening, evaluation, or treatment in the ED at this time prior to discharge.  The patient is safe for discharge with strict return precautions.       Final Clinical Impression(s) / ED Diagnoses Final diagnoses:  Spasm of thoracic back muscle    The patient appears reasonably screened and/or stabilized for discharge and I doubt any other medical condition or other [facility] requiring further screening, evaluation, or treatment in the ED at this time prior to discharge.  Disposition: Discharge  Condition: Good  I have discussed the results, Dx and Tx plan with the patient who expressed understanding and agree(s) with the plan. Discharge instructions discussed at great length. The patient was given strict return precautions who verbalized understanding of the instructions. No further questions at time of discharge.    ED Discharge Orders         Ordered    cyclobenzaprine (FLEXERIL) 10 MG tablet  Daily at bedtime     08/15/19 0640            Follow Up: Primary care provider  Schedule an appointment as soon as possible for a visit  If you do not have a primary care physician, contact [facility] at [phone] for referral      This chart was dictated using voice recognition software.  Despite best efforts to proofread,  errors can occur which can change the documentation meaning.   Fatima Blank, MD 08/15/19 [phone]

## 2019-08-15 NOTE — ED Notes (Signed)
Pt d/c home with husband. avs given opportunity for questions provided and answered with teachback

## 2019-08-15 NOTE — Discharge Instructions (Addendum)
You may use over-the-counter Motrin (Ibuprofen), Acetaminophen (Tylenol), topical muscle creams such as SalonPas, Icy Hot, Bengay, etc. Please stretch, apply heat, and have massage therapy for additional assistance. ° °

## 2019-11-26 ENCOUNTER — Ambulatory Visit (HOSPITAL_COMMUNITY)
Admission: EM | Admit: 2019-11-26 | Discharge: 2019-11-26 | Disposition: A | Discharge status: Home / Self Care | Payer: BC Managed Care – PPO | Attending: Family Medicine | Admitting: Family Medicine

## 2019-11-26 ENCOUNTER — Other Ambulatory Visit: Payer: Self-pay

## 2019-11-26 ENCOUNTER — Encounter (HOSPITAL_COMMUNITY): Payer: Self-pay

## 2019-11-26 DIAGNOSIS — R197 Diarrhea, unspecified: Secondary | ICD-10-CM | POA: Insufficient documentation

## 2019-11-26 DIAGNOSIS — Z20822 Contact with and (suspected) exposure to covid-19: Secondary | ICD-10-CM | POA: Insufficient documentation

## 2019-11-26 DIAGNOSIS — B349 Viral infection, unspecified: Secondary | ICD-10-CM | POA: Insufficient documentation

## 2019-11-26 DIAGNOSIS — R05 Cough: Secondary | ICD-10-CM | POA: Insufficient documentation

## 2019-11-26 DIAGNOSIS — R0789 Other chest pain: Secondary | ICD-10-CM | POA: Insufficient documentation

## 2019-11-26 DIAGNOSIS — R52 Pain, unspecified: Secondary | ICD-10-CM

## 2019-11-26 DIAGNOSIS — R059 Cough, unspecified: Secondary | ICD-10-CM

## 2019-11-26 LAB — INFLUENZA A AND B ANTIGEN (CONVERTED LAB)
Influenza A Ag: NEGATIVE
Influenza B Ag: NEGATIVE

## 2019-11-26 LAB — POC INFLUENZA A AND B ANTIGEN (URGENT CARE ONLY)
Influenza A Ag: NEGATIVE
Influenza B Ag: NEGATIVE

## 2019-11-26 MED ORDER — BENZONATATE 100 MG PO CAPS: 100.0000 mg | ORAL_CAPSULE | Freq: Three times a day (TID) | ORAL | 0 refills | Status: DC | PRN

## 2019-11-26 MED ORDER — PROMETHAZINE-DM 6.25-15 MG/5ML PO SYRP: 5.0000 mL | ORAL_SOLUTION | Freq: Every evening | ORAL | 0 refills | Status: DC | PRN

## 2019-11-26 MED ORDER — ACETAMINOPHEN 325 MG PO TABS
650.0000 mg | ORAL_TABLET | Freq: Once | ORAL | Status: AC
  Administered 2019-11-26: 650 mg via ORAL

## 2019-11-26 MED ORDER — ACETAMINOPHEN 325 MG PO TABS
ORAL_TABLET | ORAL | Status: AC
  Filled 2019-11-26: qty 2

## 2019-11-26 MED ORDER — CETIRIZINE HCL 10 MG PO TABS: 10.0000 mg | ORAL_TABLET | Freq: Every day | ORAL | 0 refills | Status: DC

## 2019-11-26 MED ORDER — PSEUDOEPHEDRINE HCL 60 MG PO TABS: 30.0000 mg | ORAL_TABLET | Freq: Three times a day (TID) | ORAL | 0 refills | Status: DC | PRN

## 2019-11-26 NOTE — ED Triage Notes (Signed)
Pt presents to UC with chest pain, cough, body aches and diarrhea x 2-3 days.

## 2019-11-26 NOTE — ED Provider Notes (Signed)
[facility]   MRN: [id number] DOB: [birth year]  Subjective:   Michele Becker is a 34 y.o. female presenting for 3 day hx of acute onset persistent and worsening malaise.  ROS as below. Denies direct COVID 19 contacts/exposure. 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   No current facility-administered medications for this encounter.  Current Outpatient Medications:  .  levonorgestrel (MIRENA) 20 MCG/24HR IUD, 1 each by Intrauterine route once., Disp: , Rfl:    No Known Allergies  Past Medical History:  Diagnosis Date  . Complication of anesthesia    woke up during anesthesia with wisdom teeth extraction  . Genital herpes    no outbreaks in years  . History of trichomonal vaginitis   . Iron deficiency anemia   . Sickle cell trait (HCC)   . Wears glasses      Past Surgical History:  Procedure Laterality Date  . CESAREAN SECTION N/A 12/08/2013   Procedure: CESAREAN SECTION;  Surgeon: Oliver Pila, MD;  Location: [facility];  Service: Obstetrics;  Laterality: N/A;  . CESAREAN SECTION N/A 03/08/2018   Procedure: CESAREAN SECTION;  Surgeon: Philip Aspen, DO;  Location: [facility] BIRTHING SUIT[facility];  Service: Obstetrics;  Laterality: N/A;  . CESAREAN SECTION  2011  . DILATION AND EVACUATION N/A 03/24/2019   Procedure: DILATATION AND EVACUATION;  Surgeon: Philip Aspen, DO;  Location: [facility];  Service: Gynecology;  Laterality: N/A;  . WISDOM TOOTH EXTRACTION  teen    Family History  Problem Relation Age of Onset  . Hypertension Other     Social History   Tobacco Use  . Smoking status: Never Smoker  . Smokeless tobacco: Never Used  Substance Use Topics  . Alcohol use: Yes    Alcohol/week: 7.0 standard drinks    Types: 7 Standard drinks or equivalent per week    Comment: occasional with pregnancy,  (03-20-2019 1 drink per day  . Drug use: Not Currently    Types: Marijuana    Comment: occasional      Review of Systems  Constitutional: Positive for malaise/fatigue. Negative for fever.  HENT: Negative for congestion, ear pain, sinus pain and sore throat.   Eyes: Negative for blurred vision, double vision, discharge and redness.  Respiratory: Positive for cough (productive). Negative for hemoptysis, shortness of breath and wheezing.   Cardiovascular: Positive for chest pain (mid-sternal, constant, moderate in severity).  Gastrointestinal: Positive for diarrhea. Negative for abdominal pain, nausea and vomiting.  Genitourinary: Negative for dysuria, flank pain and hematuria.  Musculoskeletal: Positive for myalgias.  Skin: Negative for rash.  Neurological: Negative for dizziness, weakness and headaches.  Psychiatric/Behavioral: Negative for depression and substance abuse.     Objective:   Vitals: BP 137/89 (BP Location: Right Arm)   Pulse 75   Temp 98.6 F (37 C) (Oral)   Resp 18   LMP  (Within Months) Comment: 1 month  SpO2 99%   Physical Exam Constitutional:      General: She is not in acute distress.    Appearance: Normal appearance. She is well-developed. She is ill-appearing. She is not toxic-appearing or diaphoretic.  HENT:     Head: Normocephalic and atraumatic.     Nose: Nose normal.     Mouth/Throat:     Mouth: Mucous membranes are moist.  Eyes:     Extraocular Movements: Extraocular movements intact.     Pupils: Pupils are equal, round, and reactive to light.  Cardiovascular:  Rate and Rhythm: Normal rate and regular rhythm.     Pulses: Normal pulses.     Heart sounds: Normal heart sounds. No murmur. No friction rub. No gallop.   Pulmonary:     Effort: Pulmonary effort is normal. No respiratory distress.     Breath sounds: Normal breath sounds. No stridor. No wheezing, rhonchi or rales.  Skin:    General: Skin is warm and dry.     Findings: No rash.  Neurological:     Mental Status: She is alert and oriented to person, place, and time.  Psychiatric:         Mood and Affect: Mood normal.        Behavior: Behavior normal.        Thought Content: Thought content normal.    No results found for this or any previous visit (from the past 24 hour(s)).   Verbal report at 10:27 for influenza test is negative.  Assessment and Plan :   1. Viral syndrome   2. Cough   3. Atypical chest pain   4. Body aches   5. Diarrhea, unspecified type   6. Exposure to COVID-19 virus     Will manage for viral illness such as viral URI, viral rhinitis, possible COVID-19. Counseled patient on nature of COVID-19 including modes of transmission, diagnostic testing, management and supportive care.  Offered symptomatic relief. COVID 19 testing is pending. Counseled patient on potential for adverse effects with medications prescribed/recommended today, ER and return-to-clinic precautions discussed, patient verbalized understanding.     Jaynee Eagles, PA-C 11/26/19 1028

## 2019-11-26 NOTE — Discharge Instructions (Signed)

## 2019-11-28 LAB — NOVEL CORONAVIRUS, NAA (HOSP ORDER, SEND-OUT TO REF LAB; TAT 18-24 HRS): SARS-CoV-2, NAA: NOT DETECTED

## 2020-02-04 ENCOUNTER — Other Ambulatory Visit: Payer: Self-pay

## 2020-02-04 ENCOUNTER — Ambulatory Visit (HOSPITAL_COMMUNITY)
Admission: EM | Admit: 2020-02-04 | Discharge: 2020-02-04 | Disposition: A | Discharge status: Home / Self Care | Payer: BC Managed Care – PPO | Attending: Internal Medicine | Admitting: Internal Medicine

## 2020-02-04 ENCOUNTER — Encounter (HOSPITAL_COMMUNITY): Payer: Self-pay

## 2020-02-04 DIAGNOSIS — M5431 Sciatica, right side: Secondary | ICD-10-CM | POA: Diagnosis not present

## 2020-02-04 MED ORDER — KETOROLAC TROMETHAMINE 30 MG/ML IJ SOLN
INTRAMUSCULAR | Status: AC
  Filled 2020-02-04: qty 1

## 2020-02-04 MED ORDER — CYCLOBENZAPRINE HCL 10 MG PO TABS: 10.0000 mg | ORAL_TABLET | Freq: Three times a day (TID) | ORAL | 0 refills | Status: AC | PRN

## 2020-02-04 MED ORDER — IBUPROFEN 600 MG PO TABS: 600.0000 mg | ORAL_TABLET | Freq: Four times a day (QID) | ORAL | 0 refills | Status: DC | PRN

## 2020-02-04 MED ORDER — KETOROLAC TROMETHAMINE 30 MG/ML IJ SOLN
30.0000 mg | Freq: Once | INTRAMUSCULAR | Status: AC
  Administered 2020-02-04: 30 mg via INTRAMUSCULAR

## 2020-02-04 MED ORDER — PREDNISONE 20 MG PO TABS: 20.0000 mg | ORAL_TABLET | Freq: Every day | ORAL | 0 refills | Status: AC

## 2020-02-04 NOTE — ED Triage Notes (Signed)
Patient reports intermittent sharp lower back pain and numbness in the right leg. States this has been an ongoing problem for a while, but started getting really bad on Monday.

## 2020-02-04 NOTE — ED Provider Notes (Signed)
[place]    CSN: [id number] Arrival date & time: 02/04/20  1605      History   Chief Complaint Chief Complaint  Patient presents with  . Back Pain    HPI Kindell LEONTINE RADMAN is a 34 y.o. female comes to urgent care with complaints of severe lower back pain of several months duration.  The past few days the back pain has worsened and has become constant.  Patient describes pain as sharp, of moderate severity-7 out of 10, radiates to the right leg and with no known relieving factors.  Patient has some numbness and tingling in the left leg.  No weakness in the left leg.  Patient denies any fall or trauma.  Her job involves repetitive movement and carrying heavy weights.  She works in [employer] that [employer] parts. HPI  Past Medical History:  Diagnosis Date  . Complication of anesthesia    woke up during anesthesia with wisdom teeth extraction  . Genital herpes    no outbreaks in years  . History of trichomonal vaginitis   . Iron deficiency anemia   . Sickle cell trait ([place])   . Wears glasses     Patient Active Problem List   Diagnosis Date Noted  . Cesarean delivery delivered 03/08/2018  . S/P repeat low transverse C-section 12/09/2013    Past Surgical History:  Procedure Laterality Date  . CESAREAN SECTION N/A 12/08/2013   Procedure: CESAREAN SECTION;  Surgeon: Logan Bores, MD;  Location: [facility];  Service: Obstetrics;  Laterality: N/A;  . CESAREAN SECTION N/A 03/08/2018   Procedure: CESAREAN SECTION;  Surgeon: Allyn Kenner, DO;  Location: [facility];  Service: Obstetrics;  Laterality: N/A;  . CESAREAN SECTION  2011  . DILATION AND EVACUATION N/A 03/24/2019   Procedure: DILATATION AND EVACUATION;  Surgeon: Allyn Kenner, DO;  Location: [facility];  Service: Gynecology;  Laterality: N/A;  . WISDOM TOOTH EXTRACTION  teen    OB History    Gravida  5   Para  3   Term  3   Preterm      AB  1   Living   3     SAB  1   TAB      Ectopic      Multiple  0   Live Births  3            Home Medications    Prior to Admission medications   Medication Sig Start Date End Date Taking? Authorizing Provider  benzonatate (TESSALON) 100 MG capsule Take 1-2 capsules (100-200 mg total) by mouth 3 (three) times daily as needed. 11/26/19   Jaynee Eagles, PA-C  cetirizine (ZYRTEC ALLERGY) 10 MG tablet Take 1 tablet (10 mg total) by mouth daily. 11/26/19   Jaynee Eagles, PA-C  cyclobenzaprine (FLEXERIL) 10 MG tablet Take 1 tablet (10 mg total) by mouth 3 (three) times daily as needed for muscle spasms. 02/04/20   Chase Picket, MD  ibuprofen (ADVIL) 600 MG tablet Take 1 tablet (600 mg total) by mouth every 6 (six) hours as needed. 02/04/20   LampteyMyrene Galas, MD  levonorgestrel (MIRENA) 20 MCG/24HR IUD 1 each by Intrauterine route once.    [provider]  predniSONE (DELTASONE) 20 MG tablet Take 1 tablet (20 mg total) by mouth daily for 5 days. 02/04/20 02/09/20  Chase Picket, MD  promethazine-dextromethorphan (PROMETHAZINE-DM) 6.25-15 MG/5ML syrup Take 5 mLs by mouth at bedtime as needed for  cough. 11/26/19   Wallis Bamberg, PA-C  pseudoephedrine (SUDAFED) 60 MG tablet Take 0.5 tablets (30 mg total) by mouth every 8 (eight) hours as needed for congestion. 11/26/19   Wallis Bamberg, PA-C  ferrous sulfate 325 (65 FE) MG tablet Take 1 tablet (325 mg total) by mouth daily with breakfast. Patient not taking: Reported on 08/15/2019 02/26/19 11/26/19  Allie Bossier, MD    Family History Family History  Problem Relation Age of Onset  . Hypertension Other     Social History Social History   Tobacco Use  . Smoking status: Never Smoker  . Smokeless tobacco: Never Used  Substance Use Topics  . Alcohol use: Yes    Alcohol/week: 7.0 standard drinks    Types: 7 Standard drinks or equivalent per week    Comment: occasional with pregnancy,  (03-20-2019 1 drink per day  . Drug use: Not Currently     Types: Marijuana    Comment: occasional     Allergies   Patient has no known allergies.   Review of Systems Review of Systems  Constitutional: Negative for activity change, fatigue and fever.  HENT: Negative.   Respiratory: Negative for cough and shortness of breath.   Gastrointestinal: Negative.   Musculoskeletal: Positive for back pain. Negative for joint swelling and neck pain.  Neurological: Positive for numbness. Negative for dizziness, light-headedness and headaches.     Physical Exam Triage Vital Signs ED Triage Vitals  Enc Vitals Group     BP 02/04/20 1626 (!) 139/92     Pulse Rate 02/04/20 1626 80     Resp 02/04/20 1626 16     Temp 02/04/20 1626 98.1 F (36.7 C)     Temp Source 02/04/20 1626 Oral     SpO2 02/04/20 1626 100 %     Weight --      Height --      Head Circumference --      Peak Flow --      Pain Score 02/04/20 1625 7     Pain Loc --      Pain Edu? --      Excl. in GC? --    No data found.  Updated Vital Signs BP (!) 139/92 (BP Location: Right Arm)   Pulse 80   Temp 98.1 F (36.7 C) (Oral)   Resp 16   SpO2 100%   Visual Acuity Right Eye Distance:   Left Eye Distance:   Bilateral Distance:    Right Eye Near:   Left Eye Near:    Bilateral Near:     Physical Exam Vitals and nursing note reviewed.  Constitutional:      General: She is not in acute distress.    Appearance: She is not ill-appearing.  Cardiovascular:     Rate and Rhythm: Normal rate and regular rhythm.     Pulses: Normal pulses.     Heart sounds: Normal heart sounds.  Pulmonary:     Effort: Pulmonary effort is normal.     Breath sounds: Normal breath sounds. No wheezing or rhonchi.  Abdominal:     General: Bowel sounds are normal.     Palpations: Abdomen is soft.     Tenderness: There is no abdominal tenderness.     Hernia: No hernia is present.  Musculoskeletal:        General: Tenderness present. No deformity or signs of injury. Normal range of motion.    Skin:    General: Skin is warm.  Capillary Refill: Capillary refill takes less than 2 seconds.     Findings: No bruising or erythema.  Neurological:     Mental Status: She is alert.     Motor: No weakness.     Gait: Gait normal.     Comments: DTR 2+ in both knees      UC Treatments / Results  Labs (all labs ordered are listed, but only abnormal results are displayed) Labs Reviewed - No data to display  EKG   Radiology No results found.  Procedures Procedures (including critical care time)  Medications Ordered in UC Medications  ketorolac (TORADOL) 30 MG/ML injection 30 mg (30 mg Intramuscular Given 02/04/20 1734)    Initial Impression / Assessment and Plan / UC Course  I have reviewed the triage vital signs and the nursing notes.  Pertinent labs & imaging results that were available during my care of the patient were reviewed by me and considered in my medical decision making (see chart for details).    1.  Sciatica on the right side: Toradol 30 mg IM x1 dose Prednisone 50 mg orally daily x5 days Ibuprofen 600 mg every 6 hours as needed for pain Flexeril as needed for muscle spasms. Gentle range of motion exercises Back strengthening exercises Weight loss may help Return precautions given Final Clinical Impressions(s) / UC Diagnoses   Final diagnoses:  Sciatica of right side   Discharge Instructions   None    ED Prescriptions    Medication Sig Dispense Auth. Provider   ibuprofen (ADVIL) 600 MG tablet Take 1 tablet (600 mg total) by mouth every 6 (six) hours as needed. 30 tablet Zarian Colpitts, Britta Mccreedy, MD   cyclobenzaprine (FLEXERIL) 10 MG tablet Take 1 tablet (10 mg total) by mouth 3 (three) times daily as needed for muscle spasms. 30 tablet Khilynn Borntreger, Britta Mccreedy, MD   predniSONE (DELTASONE) 20 MG tablet Take 1 tablet (20 mg total) by mouth daily for 5 days. 5 tablet Jabez Molner, Britta Mccreedy, MD     PDMP not reviewed this encounter.   Merrilee Jansky,  MD 02/04/20 [phone]

## 2020-02-16 ENCOUNTER — Emergency Department (HOSPITAL_COMMUNITY)
Admission: EM | Admit: 2020-02-16 | Discharge: 2020-02-16 | Disposition: A | Discharge status: Home / Self Care | Payer: BC Managed Care – PPO | Attending: Emergency Medicine | Admitting: Emergency Medicine

## 2020-02-16 ENCOUNTER — Other Ambulatory Visit: Payer: Self-pay

## 2020-02-16 ENCOUNTER — Encounter (HOSPITAL_COMMUNITY): Payer: Self-pay | Admitting: *Deleted

## 2020-02-16 DIAGNOSIS — M25511 Pain in right shoulder: Secondary | ICD-10-CM | POA: Diagnosis present

## 2020-02-16 DIAGNOSIS — Y9389 Activity, other specified: Secondary | ICD-10-CM | POA: Diagnosis not present

## 2020-02-16 DIAGNOSIS — Y998 Other external cause status: Secondary | ICD-10-CM | POA: Diagnosis not present

## 2020-02-16 DIAGNOSIS — M7918 Myalgia, other site: Secondary | ICD-10-CM | POA: Diagnosis not present

## 2020-02-16 DIAGNOSIS — Y92410 Unspecified street and highway as the place of occurrence of the external cause: Secondary | ICD-10-CM | POA: Diagnosis not present

## 2020-02-16 DIAGNOSIS — Z79899 Other long term (current) drug therapy: Secondary | ICD-10-CM | POA: Diagnosis not present

## 2020-02-16 DIAGNOSIS — M545 Low back pain: Secondary | ICD-10-CM | POA: Insufficient documentation
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
## 2020-02-16 NOTE — Discharge Instructions (Signed)
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
## 2020-02-27 ENCOUNTER — Telehealth (INDEPENDENT_AMBULATORY_CARE_PROVIDER_SITE_OTHER): Payer: BC Managed Care – PPO | Admitting: Primary Care

## 2020-02-27 DIAGNOSIS — Z7689 Persons encountering health services in other specified circumstances: Secondary | ICD-10-CM | POA: Diagnosis not present

## 2020-02-27 DIAGNOSIS — Z09 Encounter for follow-up examination after completed treatment for conditions other than malignant neoplasm: Secondary | ICD-10-CM | POA: Diagnosis not present

## 2020-02-27 MED ORDER — IBUPROFEN 800 MG PO TABS: 600.0000 mg | ORAL_TABLET | Freq: Three times a day (TID) | ORAL | 1 refills | Status: AC | PRN

## 2020-03-02 ENCOUNTER — Encounter: Payer: Self-pay | Admitting: Family Medicine

## 2020-03-02 ENCOUNTER — Ambulatory Visit (INDEPENDENT_AMBULATORY_CARE_PROVIDER_SITE_OTHER): Payer: BC Managed Care – PPO | Admitting: Family Medicine

## 2020-03-02 ENCOUNTER — Other Ambulatory Visit: Payer: Self-pay

## 2020-03-02 DIAGNOSIS — M5441 Lumbago with sciatica, right side: Secondary | ICD-10-CM

## 2020-03-02 MED ORDER — BACLOFEN 10 MG PO TABS: 5.0000 mg | ORAL_TABLET | Freq: Three times a day (TID) | ORAL | 3 refills | Status: AC | PRN

## 2020-03-02 MED ORDER — GABAPENTIN 100 MG PO CAPS: ORAL_CAPSULE | ORAL | 3 refills | Status: AC
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
## 2020-03-08 ENCOUNTER — Other Ambulatory Visit: Payer: Self-pay

## 2020-03-08 ENCOUNTER — Ambulatory Visit (INDEPENDENT_AMBULATORY_CARE_PROVIDER_SITE_OTHER): Payer: BC Managed Care – PPO | Admitting: Physical Therapy

## 2020-03-08 ENCOUNTER — Encounter: Payer: Self-pay | Admitting: Physical Therapy

## 2020-03-08 DIAGNOSIS — R293 Abnormal posture: Secondary | ICD-10-CM

## 2020-03-08 DIAGNOSIS — M5417 Radiculopathy, lumbosacral region: Secondary | ICD-10-CM | POA: Diagnosis not present

## 2020-03-08 DIAGNOSIS — R29898 Other symptoms and signs involving the musculoskeletal system: Secondary | ICD-10-CM | POA: Diagnosis not present

## 2020-03-08 DIAGNOSIS — M545 Low back pain, unspecified: Secondary | ICD-10-CM
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
## 2020-03-08 NOTE — Patient Instructions (Signed)
Access Code: [email or web address] URL: [email or web address] Date: 03/08/2020 Prepared by: Moshe Cipro  Exercises Standing Glute Med Mobilization with Gar [place] on Wall - 2 x daily - 7 x weekly - 1 sets - 1 reps - 2-3 min hold Standing Quadratus Lumborum Mobilization with Small Ball on Wall - 2 x daily - 7 x weekly - 1 sets - 1 reps - 2-3 hold Seated Hamstring Stretch - 2 x daily - 7 x weekly - 3 reps - 1 sets - 30 sec hold Supine Piriformis Stretch with Foot on Ground - 2 x daily - 7 x weekly - 3 reps - 1 sets - 30 sec hold Hooklying Single Knee to Chest - 2 x daily - 7 x weekly - 3 reps - 1 sets - 30 sec hold

## 2020-03-19 ENCOUNTER — Other Ambulatory Visit: Payer: Self-pay

## 2020-03-19 ENCOUNTER — Encounter: Payer: Self-pay | Admitting: Rehabilitative and Restorative Service Providers"

## 2020-03-19 ENCOUNTER — Ambulatory Visit (INDEPENDENT_AMBULATORY_CARE_PROVIDER_SITE_OTHER): Payer: BC Managed Care – PPO | Admitting: Rehabilitative and Restorative Service Providers"

## 2020-03-19 DIAGNOSIS — M545 Low back pain, unspecified: Secondary | ICD-10-CM

## 2020-03-19 DIAGNOSIS — R29898 Other symptoms and signs involving the musculoskeletal system: Secondary | ICD-10-CM

## 2020-03-19 DIAGNOSIS — R293 Abnormal posture: Secondary | ICD-10-CM | POA: Diagnosis not present

## 2020-03-19 DIAGNOSIS — M5417 Radiculopathy, lumbosacral region: Secondary | ICD-10-CM | POA: Diagnosis not present

## 2020-03-19 NOTE — Therapy (Signed)
[facility] [address] [place], [place], [address] Phone: [phone]   Fax:  [phone]  Physical Therapy Treatment  Patient Details  Name: Michele Becker MRN: [id number] Date of Birth: 08/28/1986 Referring Provider (PT): Lavada Mesi, MD   Encounter Date: 03/19/2020  PT End of Session - 03/19/20 1343    Visit Number  2    Number of Visits  12    Date for PT Re-Evaluation  04/19/20    PT Start Time  1344    PT Stop Time  1423    PT Time Calculation (min)  39 min    Activity Tolerance  Patient tolerated treatment well    Behavior During Therapy  [facility] for tasks assessed/performed       Past Medical History:  Diagnosis Date  . Complication of anesthesia    woke up during anesthesia with wisdom teeth extraction  . Genital herpes    no outbreaks in years  . History of trichomonal vaginitis   . Iron deficiency anemia   . Sickle cell trait (HCC)   . Wears glasses     Past Surgical History:  Procedure Laterality Date  . CESAREAN SECTION N/A 12/08/2013   Procedure: CESAREAN SECTION;  Surgeon: Oliver Pila, MD;  Location: [facility];  Service: Obstetrics;  Laterality: N/A;  . CESAREAN SECTION N/A 03/08/2018   Procedure: CESAREAN SECTION;  Surgeon: Philip Aspen, DO;  Location: [facility];  Service: Obstetrics;  Laterality: N/A;  . CESAREAN SECTION  2011  . DILATION AND EVACUATION N/A 03/24/2019   Procedure: DILATATION AND EVACUATION;  Surgeon: Philip Aspen, DO;  Location: [facility];  Service: Gynecology;  Laterality: N/A;  . WISDOM TOOTH EXTRACTION  teen    There were no vitals filed for this visit.  Subjective Assessment - 03/19/20 1349    Subjective  Pt. indicated having a few days of having some complaints but overall doing ok.  Pt. stated some days of having some improvement.    Limitations  Standing;Sitting;Walking;Lifting    How long can you sit comfortably?  15-20 min    Patient Stated Goals   "get down to cause of the problem" play with children    Currently in Pain?  No/denies    Pain Score  0-No pain    Pain Location  Back    Pain Orientation  Right    Pain Onset  More than a month ago    Aggravating Factors   prolonged walking, standing    Pain Relieving Factors  rest                        [facility] Adult PT Treatment/Exercise - 03/19/20 0001      Lumbar Exercises: Stretches   Passive Hamstring Stretch  5 reps   15 sec x 5 bilateral   Lower Trunk Rotation  5 reps;Other (comment)   15 sec x 5 bilateral   Piriformis Stretch  5 reps;Other (comment)   15 sec x 5 bilateral     Lumbar Exercises: Aerobic   Nustep  lvl 5 10 mins      Lumbar Exercises: Supine   Bridge  20 reps      Lumbar Exercises: Sidelying   Clam  Other (comment);Both   3 x 10 bilateral                 PT Long Term Goals - 03/08/20 1402      PT LONG  TERM GOAL #1   Title  independent with HEP    Status  New    Target Date  04/19/20      PT LONG TERM GOAL #2   Title  perform lumbar ROM without increase in pain for improved function    Status  New    Target Date  04/19/20      PT LONG TERM GOAL #3   Title  demonstrate ability to lift at least 20-25# without increase in pain and demonstrating proper technique for improved function    Status  New    Target Date  04/19/20      PT LONG TERM GOAL #4   Title  report pain < 4/10 with walking > 30 min for improved function    Status  New    Target Date  04/19/20            Plan - 03/19/20 1410    Clinical Impression Statement  Indications of improved tolerance to lumbar and hip mobility as observed in clinic c reduced tightness complaints overall.  Rt lumbar concordant tightness indicated c lumbar rotation movements.  Continued skilled PT services indicated at this time.    Personal Factors and Comorbidities  Comorbidity 1    Comorbidities  sickle cell trait    Examination-Activity Limitations   Lift;Stand;Locomotion Level;Bend;Transfers;Sit;Squat;Stairs    Examination-Participation Restrictions  Other   occupation   Stability/Clinical Decision Making  Evolving/Moderate complexity    Rehab Potential  Good    PT Frequency  2x / week    PT Duration  6 weeks    PT Treatment/Interventions  ADLs/Self Care Home Management;Cryotherapy;Ultrasound;Traction;Moist Heat;Electrical Stimulation;Gait training;Stair training;Functional mobility training;Therapeutic activities;Patient/family education;Neuromuscular re-education;Therapeutic exercise;Manual techniques;Dry needling;Taping    PT Next Visit Plan  Lumbar myofascial mobility, hip mobility/strength gains.    PT Home Exercise Plan  Access Code: [email or web address]    Consulted and Agree with Plan of Care  Patient       Patient will benefit from skilled therapeutic intervention in order to improve the following deficits and impairments:  Increased fascial restricitons, Increased muscle spasms, Pain, Postural dysfunction, Impaired flexibility, Decreased endurance, Decreased activity tolerance  Visit Diagnosis: Acute right-sided low back pain without sciatica  Radiculopathy, lumbosacral region  Abnormal posture  Other symptoms and signs involving the musculoskeletal system     Problem List Patient Active Problem List   Diagnosis Date Noted  . Cesarean delivery delivered 03/08/2018  . S/P repeat low transverse C-section 12/09/2013    Scot Jun, PT, DPT, OCS, ATC 03/19/20  2:13 PM    [facility] [address] [place], [place], [address] Phone: [phone]   Fax:  [phone]  Name: Michele Becker MRN: [id number] Date of Birth: [birth year]

## 2020-03-24 ENCOUNTER — Encounter: Payer: BC Managed Care – PPO | Admitting: Physical Therapy

## 2020-03-25 ENCOUNTER — Telehealth: Payer: Self-pay | Admitting: Family Medicine

## 2020-03-25 NOTE — Telephone Encounter (Signed)
Form received from [place]. Sent to [facility]. 

## 2020-03-26 ENCOUNTER — Encounter: Payer: BC Managed Care – PPO | Admitting: Physical Therapy

## 2020-03-26 ENCOUNTER — Telehealth: Payer: Self-pay | Admitting: Physical Therapy

## 2020-03-26 NOTE — Telephone Encounter (Signed)
Spoke to pt and she reports she forgot about PT appt today.  Reminded of next appointment.    Michele Becker, PT, DPT 03/26/20 8:21 AM

## 2020-03-30 ENCOUNTER — Encounter: Payer: Self-pay | Admitting: Family Medicine

## 2020-03-30 ENCOUNTER — Ambulatory Visit (INDEPENDENT_AMBULATORY_CARE_PROVIDER_SITE_OTHER): Payer: BC Managed Care – PPO | Admitting: Family Medicine

## 2020-03-30 ENCOUNTER — Ambulatory Visit (INDEPENDENT_AMBULATORY_CARE_PROVIDER_SITE_OTHER): Payer: BC Managed Care – PPO | Admitting: Physical Therapy

## 2020-03-30 ENCOUNTER — Other Ambulatory Visit: Payer: Self-pay

## 2020-03-30 ENCOUNTER — Encounter: Payer: Self-pay | Admitting: Physical Therapy

## 2020-03-30 DIAGNOSIS — R29898 Other symptoms and signs involving the musculoskeletal system: Secondary | ICD-10-CM | POA: Diagnosis not present

## 2020-03-30 DIAGNOSIS — M5417 Radiculopathy, lumbosacral region: Secondary | ICD-10-CM

## 2020-03-30 DIAGNOSIS — M5441 Lumbago with sciatica, right side: Secondary | ICD-10-CM

## 2020-03-30 DIAGNOSIS — R293 Abnormal posture: Secondary | ICD-10-CM

## 2020-03-30 DIAGNOSIS — M545 Low back pain, unspecified: Secondary | ICD-10-CM
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
## 2020-03-30 NOTE — Progress Notes (Signed)
Patient here to followup with her backafter therapy. She states she is doing much better! She states the muscle spasms have also really decreased!

## 2020-03-30 NOTE — Therapy (Signed)
[place], [place], [address] Phone: [phone]   Fax:  [phone]  Physical Therapy Treatment  Patient Details  Name: KAYCI BELLEVILLE MRN: [id number] Date of Birth: 1985/10/21 Referring Provider (PT): Eunice Blase, MD   Encounter Date: 03/30/2020   PT End of Session - 03/30/20 0842    Visit Number 3    Number of Visits 12    Date for PT Re-Evaluation 04/19/20    PT Start Time 0802    PT Stop Time 0842    PT Time Calculation (min) 40 min    Activity Tolerance Patient tolerated treatment well    Behavior During Therapy [facility] for tasks assessed/performed           Past Medical History:  Diagnosis Date   Complication of anesthesia    woke up during anesthesia with wisdom teeth extraction   Genital herpes    no outbreaks in years   History of trichomonal vaginitis    Iron deficiency anemia    Sickle cell trait ([place])    Wears glasses     Past Surgical History:  Procedure Laterality Date   CESAREAN SECTION N/A 12/08/2013   Procedure: CESAREAN SECTION;  Surgeon: Logan Bores, MD;  Location: [facility];  Service: Obstetrics;  Laterality: N/A;   CESAREAN SECTION N/A 03/08/2018   Procedure: CESAREAN SECTION;  Surgeon: Allyn Kenner, DO;  Location: [place];  Service: Obstetrics;  Laterality: N/A;   CESAREAN SECTION  2011   DILATION AND EVACUATION N/A 03/24/2019   Procedure: DILATATION AND EVACUATION;  Surgeon: Allyn Kenner, DO;  Location: [place];  Service: Gynecology;  Laterality: N/A;   WISDOM TOOTH EXTRACTION  teen    There were no vitals filed for this visit.   Subjective Assessment - 03/30/20 0805    Subjective overall doing well, no pain and can tell a difference with the exercises.    Limitations Standing;Sitting;Walking;Lifting    How long can you sit comfortably? 15-20 min    Patient Stated Goals "get down to cause of the problem" play with children     Currently in Pain? No/denies    Pain Onset More than a month ago                             [facility] Adult PT Treatment/Exercise - 03/30/20 0806      Lumbar Exercises: Stretches   Passive Hamstring Stretch Right;Left;4 reps;20 seconds    Lower Trunk Rotation 3 reps;20 seconds   bil     Lumbar Exercises: Aerobic   Nustep lvl 6 10 mins      Lumbar Exercises: Supine   Pelvic Tilt 10 reps;5 seconds    Clam 10 reps;5 seconds    Clam Limitations green theraband; single limb, bil    Bridge 20 reps                       PT Long Term Goals - 03/08/20 1402      PT LONG TERM GOAL #1   Title independent with HEP    Status New    Target Date 04/19/20      PT LONG TERM GOAL #2   Title perform lumbar ROM without increase in pain for improved function    Status New    Target Date 04/19/20      PT LONG TERM GOAL #3   Title demonstrate ability to  lift at least 20-25# without increase in pain and demonstrating proper technique for improved function    Status New    Target Date 04/19/20      PT LONG TERM GOAL #4   Title report pain < 4/10 with walking > 30 min for improved function    Status New    Target Date 04/19/20                 Plan - 03/30/20 0842    Clinical Impression Statement Pt doing well reporting no spasms since doing HEP, but has not returned to work yet.  Plan to do work simulated activities next visit in preparation for return to work and may be ready from hold from PT if tolerates well.    Personal Factors and Comorbidities Comorbidity 1    Comorbidities sickle cell trait    Examination-Activity Limitations Lift;Stand;Locomotion Level;Bend;Transfers;Sit;Squat;Stairs    Examination-Participation Restrictions Other   occupation   Stability/Clinical Decision Making Evolving/Moderate complexity    Rehab Potential Good    PT Frequency 2x / week    PT Duration 6 weeks    PT Treatment/Interventions ADLs/Self Care Home  Management;Cryotherapy;Ultrasound;Traction;Moist Heat;Electrical Stimulation;Gait training;Stair training;Functional mobility training;Therapeutic activities;Patient/family education;Neuromuscular re-education;Therapeutic exercise;Manual techniques;Dry needling;Taping    PT Next Visit Plan Lumbar myofascial mobility, hip mobility/strength gains. work simulated tasks    PT Home Exercise Plan Access Code: [id number]    Consulted and Agree with Plan of Care Patient           Patient will benefit from skilled therapeutic intervention in order to improve the following deficits and impairments:  Increased fascial restricitons, Increased muscle spasms, Pain, Postural dysfunction, Impaired flexibility, Decreased endurance, Decreased activity tolerance  Visit Diagnosis: Acute right-sided low back pain without sciatica  Radiculopathy, lumbosacral region  Abnormal posture  Other symptoms and signs involving the musculoskeletal system     Problem List Patient Active Problem List   Diagnosis Date Noted   Cesarean delivery delivered 03/08/2018   S/P repeat low transverse C-section 12/09/2013      Clarita Crane, PT, DPT 03/30/20 8:44 AM     [facility] Physical Therapy [address] [place], [place], [address] Phone: [phone]   Fax:  [phone]  Name: CHALLIS CRILL MRN: [id number] Date of Birth: [birth year]

## 2020-04-01 ENCOUNTER — Telehealth: Payer: Self-pay

## 2020-04-01 NOTE — Telephone Encounter (Signed)
Trula Ore called in wanting to receive office notes for 03/02/2020 & 03/30/2020.   Fax # [phone]

## 2020-04-02 ENCOUNTER — Ambulatory Visit (INDEPENDENT_AMBULATORY_CARE_PROVIDER_SITE_OTHER): Payer: BC Managed Care – PPO | Admitting: Physical Therapy

## 2020-04-02 ENCOUNTER — Encounter: Payer: Self-pay | Admitting: Physical Therapy

## 2020-04-02 ENCOUNTER — Other Ambulatory Visit: Payer: Self-pay

## 2020-04-02 DIAGNOSIS — M545 Low back pain, unspecified: Secondary | ICD-10-CM

## 2020-04-02 DIAGNOSIS — R293 Abnormal posture: Secondary | ICD-10-CM | POA: Diagnosis not present

## 2020-04-02 DIAGNOSIS — R29898 Other symptoms and signs involving the musculoskeletal system: Secondary | ICD-10-CM | POA: Diagnosis not present

## 2020-04-02 DIAGNOSIS — M5417 Radiculopathy, lumbosacral region: Secondary | ICD-10-CM | POA: Diagnosis not present

## 2020-04-02 NOTE — Therapy (Signed)
[facility] Physical Therapy [address] [place], [place], [address] Phone: [phone]   Fax:  [phone]  Physical Therapy Treatment  Patient Details  Name: Michele Becker MRN: [id number] Date of Birth: 06/10/1986 Referring Provider (PT): Hilts, Michael, MD   Encounter Date: 04/02/2020   PT End of Session - 04/02/20 0840    Visit Number 4    Number of Visits 12    Date for PT Re-Evaluation 04/19/20    PT Start Time 0802    PT Stop Time 0840    PT Time Calculation (min) 38 min    Activity Tolerance Patient tolerated treatment well    Behavior During Therapy [facility] for tasks assessed/performed           Past Medical History:  Diagnosis Date  . Complication of anesthesia    woke up during anesthesia with wisdom teeth extraction  . Genital herpes    no outbreaks in years  . History of trichomonal vaginitis   . Iron deficiency anemia   . Sickle cell trait ([facility])   . Wears glasses     Past Surgical History:  Procedure Laterality Date  . CESAREAN SECTION N/A 12/08/2013   Procedure: CESAREAN SECTION;  Surgeon: Kathy W Richardson, MD;  Location: [facility];  Service: Obstetrics;  Laterality: N/A;  . CESAREAN SECTION N/A 03/08/2018   Procedure: CESAREAN SECTION;  Surgeon: Callahan, Sidney, DO;  Location: [facility];  Service: Obstetrics;  Laterality: N/A;  . CESAREAN SECTION  2011  . DILATION AND EVACUATION N/A 03/24/2019   Procedure: DILATATION AND EVACUATION;  Surgeon: Callahan, Sidney, DO;  Location: [facility];  Service: Gynecology;  Laterality: N/A;  . WISDOM TOOTH EXTRACTION  [age]    There were no vitals filed for this visit.   Subjective Assessment - 04/02/20 0804    Subjective doing well at this time.  returns to work on Monday.    Limitations Standing;Sitting;Walking;Lifting    How long can you sit comfortably? 15-20 min    Patient Stated Goals "get down to cause of the problem" play with children    Currently in Pain?  No/denies    Pain Onset More than a month ago                             [facility] Adult PT Treatment/Exercise - 04/02/20 0810      Therapeutic Activites    Therapeutic Activities Lifting;Work Simulation    Lifting lifting 20-45# with education for proper lifting mechanics    Work Simulation pushing cart with up to 70# with resistance to simulate pushing heavy work cart, cues for technique and form especially with initiation of walking      Lumbar Exercises: Aerobic   Nustep lvl 5 10 mins                       PT Long Term Goals - 04/02/20 0841      PT LONG TERM GOAL #1   Title independent with HEP    Status On-going    Target Date 04/19/20      PT LONG TERM GOAL #2   Title perform lumbar ROM without increase in pain for improved function    Status On-going      PT LONG TERM GOAL #3   Title demonstrate ability to lift at least 20-25# without increase in pain and demonstrating proper technique for improved function      Status Achieved      PT LONG TERM GOAL #4   Title report pain < 4/10 with walking > 30 min for improved function    Status On-going                 Plan - 04/02/20 0841    Clinical Impression Statement Pt has met LTG #3 today and overall demonstrating improved function and decreased pain with PT.  Returns to work at full duty next week so plan to see how that goes next week to determine if she's ready for d/c.    Personal Factors and Comorbidities Comorbidity 1    Comorbidities sickle cell trait    Examination-Activity Limitations Lift;Stand;Locomotion Level;Bend;Transfers;Sit;Squat;Stairs    Examination-Participation Restrictions Other   occupation   Stability/Clinical Decision Making Evolving/Moderate complexity    Rehab Potential Good    PT Frequency 2x / week    PT Duration 6 weeks    PT Treatment/Interventions ADLs/Self Care Home Management;Cryotherapy;Ultrasound;Traction;Moist Heat;Electrical Stimulation;Gait  training;Stair training;Functional mobility training;Therapeutic activities;Patient/family education;Neuromuscular re-education;Therapeutic exercise;Manual techniques;Dry needling;Taping    PT Next Visit Plan Lumbar myofascial mobility, hip mobility/strength gains. work simulated tasks, see how return to work went    PT Home Exercise Plan Access Code: [email or web address]    Consulted and Agree with Plan of Care Patient           Patient will benefit from skilled therapeutic intervention in order to improve the following deficits and impairments:  Increased fascial restricitons, Increased muscle spasms, Pain, Postural dysfunction, Impaired flexibility, Decreased endurance, Decreased activity tolerance  Visit Diagnosis: Acute right-sided low back pain without sciatica  Radiculopathy, lumbosacral region  Abnormal posture  Other symptoms and signs involving the musculoskeletal system     Problem List Patient Active Problem List   Diagnosis Date Noted  . Cesarean delivery delivered 03/08/2018  . S/P repeat low transverse C-section 12/09/2013      Stephanie F Matthews, PT, DPT 04/02/20 8:43 AM    [facility] Therapy [address] [place], [place], [address] Phone: [phone]   Fax:  [phone]  Name: Michele Becker MRN: [id number] Date of Birth: 07/11/1986   

## 2020-04-02 NOTE — Telephone Encounter (Signed)
[employer]ula Ore called again.   Repeat message. Requesting the same office notes as before.

## 2020-04-05 NOTE — Telephone Encounter (Signed)
Do not know who Trula Ore is or what company she is with, msg does not provide that info.no return ph number,unable to call.

## 2020-04-09 ENCOUNTER — Encounter: Payer: Self-pay | Admitting: Physical Therapy

## 2020-04-09 ENCOUNTER — Ambulatory Visit (INDEPENDENT_AMBULATORY_CARE_PROVIDER_SITE_OTHER): Payer: BC Managed Care – PPO | Admitting: Physical Therapy

## 2020-04-09 ENCOUNTER — Other Ambulatory Visit: Payer: Self-pay

## 2020-04-09 DIAGNOSIS — M545 Low back pain, unspecified: Secondary | ICD-10-CM

## 2020-04-09 DIAGNOSIS — R29898 Other symptoms and signs involving the musculoskeletal system: Secondary | ICD-10-CM

## 2020-04-09 DIAGNOSIS — M5417 Radiculopathy, lumbosacral region: Secondary | ICD-10-CM | POA: Diagnosis not present

## 2020-04-09 DIAGNOSIS — R293 Abnormal posture: Secondary | ICD-10-CM

## 2020-04-09 NOTE — Therapy (Signed)
[facility] Physical Therapy [address] [place], [place], [address] Phone: [phone]   Fax:  [phone]  Physical Therapy Treatment/Discharge Summary  Patient Details  Name: KIMBLY EANES MRN: [id number] Date of Birth: 1986/06/11 Referring Provider (PT): Eunice Blase, MD   Encounter Date: 04/09/2020   PT End of Session - 04/09/20 0839    Visit Number 5    Number of Visits 12    Date for PT Re-Evaluation 04/19/20    PT Start Time 0804    PT Stop Time 0836    PT Time Calculation (min) 32 min    Activity Tolerance Patient tolerated treatment well    Behavior During Therapy [facility] for tasks assessed/performed           Past Medical History:  Diagnosis Date  . Complication of anesthesia    woke up during anesthesia with wisdom teeth extraction  . Genital herpes    no outbreaks in years  . History of trichomonal vaginitis   . Iron deficiency anemia   . Sickle cell trait ([place])   . Wears glasses     Past Surgical History:  Procedure Laterality Date  . CESAREAN SECTION N/A 12/08/2013   Procedure: CESAREAN SECTION;  Surgeon: Logan Bores, MD;  Location: [facility];  Service: Obstetrics;  Laterality: N/A;  . CESAREAN SECTION N/A 03/08/2018   Procedure: CESAREAN SECTION;  Surgeon: Allyn Kenner, DO;  Location: [place];  Service: Obstetrics;  Laterality: N/A;  . CESAREAN SECTION  2011  . DILATION AND EVACUATION N/A 03/24/2019   Procedure: DILATATION AND EVACUATION;  Surgeon: Allyn Kenner, DO;  Location: [place];  Service: Gynecology;  Laterality: N/A;  . WISDOM TOOTH EXTRACTION  [age]    There were no vitals filed for this visit.   Subjective Assessment - 04/09/20 0807    Subjective has a new job position that requires no heavy lifting; so work is going well without any issues except fatigue from working 4, 12-hour shifts straight.    Limitations Standing;Sitting;Walking;Lifting    How long can you sit  comfortably? no limitations    Patient Stated Goals "get down to cause of the problem" play with children    Currently in Pain? No/denies    Pain Onset More than a month ago              [facility] PT Assessment - 04/09/20 0837      AROM   Overall AROM Comments no pain with all motion except mild pain with Rt sidebending                         [facility] Adult PT Treatment/Exercise - 04/09/20 0808      Lumbar Exercises: Stretches   Passive Hamstring Stretch Right;Left;3 reps;30 seconds    Single Knee to Chest Stretch Right;Left;3 reps;30 seconds    Lower Trunk Rotation 3 reps;20 seconds    Piriformis Stretch Right;Left;3 reps;30 seconds      Lumbar Exercises: Aerobic   Recumbent Bike L3 x 6 min                       PT Long Term Goals - 04/09/20 0839      PT LONG TERM GOAL #1   Title independent with HEP    Status Achieved      PT LONG TERM GOAL #2   Title perform lumbar ROM without increase in pain for improved function  Status Partially Met      PT LONG TERM GOAL #3   Title demonstrate ability to lift at least 20-25# without increase in pain and demonstrating proper technique for improved function    Status Achieved      PT LONG TERM GOAL #4   Title report pain < 4/10 with walking > 30 min for improved function    Status Achieved                 Plan - 04/09/20 0839    Clinical Impression Statement Pt has met/partially met all goals and feels ready for d/c today. Will d/c PT.    Personal Factors and Comorbidities Comorbidity 1    Comorbidities sickle cell trait    Examination-Activity Limitations Lift;Stand;Locomotion Level;Bend;Transfers;Sit;Squat;Stairs    Examination-Participation Restrictions Other   occupation   Stability/Clinical Decision Making Evolving/Moderate complexity    Rehab Potential Good    PT Frequency 2x / week    PT Duration 6 weeks    PT Treatment/Interventions ADLs/Self Care Home  Management;Cryotherapy;Ultrasound;Traction;Moist Heat;Electrical Stimulation;Gait training;Stair training;Functional mobility training;Therapeutic activities;Patient/family education;Neuromuscular re-education;Therapeutic exercise;Manual techniques;Dry needling;Taping    PT Next Visit Plan d/c PT today    PT Home Exercise Plan Access Code: [email or web address]    Consulted and Agree with Plan of Care Patient           Patient will benefit from skilled therapeutic intervention in order to improve the following deficits and impairments:  Increased fascial restricitons, Increased muscle spasms, Pain, Postural dysfunction, Impaired flexibility, Decreased endurance, Decreased activity tolerance  Visit Diagnosis: Acute right-sided low back pain without sciatica  Radiculopathy, lumbosacral region  Abnormal posture  Other symptoms and signs involving the musculoskeletal system     Problem List Patient Active Problem List   Diagnosis Date Noted  . Cesarean delivery delivered 03/08/2018  . S/P repeat low transverse C-section 12/09/2013      Laureen Abrahams, PT, DPT 04/09/20 8:40 AM    [facility] Physical Therapy [address] [place], [place], [address] Phone: [phone]   Fax:  [phone]  Name: TOSHIBA NULL MRN: [id number] Date of Birth: 1985/12/15      PHYSICAL THERAPY DISCHARGE SUMMARY  Visits from Start of Care: 5  Current functional level related to goals / functional outcomes: See above   Remaining deficits: See above   Education / Equipment: HEP  Plan: Patient agrees to discharge.  Patient goals were met. Patient is being discharged due to meeting the stated rehab goals.  ?????     Laureen Abrahams, PT, DPT 04/09/20 8:41 AM  [facility] Physical Therapy [address] [place], [place], [address] Phone: [phone]   Fax:  [phone]

## 2020-04-24 ENCOUNTER — Emergency Department (HOSPITAL_COMMUNITY)
Admission: EM | Admit: 2020-04-24 | Discharge: 2020-04-24 | Disposition: A | Discharge status: Home / Self Care | Payer: BC Managed Care – PPO | Attending: Emergency Medicine | Admitting: Emergency Medicine

## 2020-04-24 ENCOUNTER — Emergency Department (HOSPITAL_COMMUNITY): Payer: BC Managed Care – PPO

## 2020-04-24 ENCOUNTER — Other Ambulatory Visit: Payer: Self-pay

## 2020-04-24 ENCOUNTER — Encounter (HOSPITAL_COMMUNITY): Payer: Self-pay | Admitting: *Deleted

## 2020-04-24 DIAGNOSIS — M25572 Pain in left ankle and joints of left foot: Secondary | ICD-10-CM | POA: Diagnosis present

## 2020-04-24 MED ORDER — NAPROXEN 500 MG PO TABS: 500.0000 mg | ORAL_TABLET | Freq: Two times a day (BID) | ORAL | 0 refills | Status: AC

## 2020-04-24 MED ORDER — NAPROXEN 250 MG PO TABS
500.0000 mg | ORAL_TABLET | Freq: Once | ORAL | Status: AC
  Administered 2020-04-24: 500 mg via ORAL
  Filled 2020-04-24: qty 2

## 2020-04-24 NOTE — Discharge Instructions (Addendum)
Your x-ray today did not show any fractures, dislocations.  I have prescribed a short course of anti-inflammatories to help with your symptoms.  The number to orthopedic specialist attached to your chart, please schedule an appointment for further follow-up.

## 2020-04-24 NOTE — ED Provider Notes (Signed)
[facility] EMERGENCY DEPARTMENT Provider Note   CSN: [id number] Arrival date & time: 04/24/20  9735     History Chief Complaint  Patient presents with  . Ankle Pain    Michele Becker is a 34 y.o. female.  34 y.o female with a PMH of Sickle cell trait presents to the ED with a chief complaint of left ankle pain x1 month.  She describes it as a throbbing sensation to the back of her ankle, exacerbated with ambulation.  Has not taken any medication for improvement in her symptoms.  She denies any fevers, limited range of motion.  Has not been seen for this problem previously.  No fevers, trauma, other complaints.  No prior history of blood clots.  The history is provided by the patient.  Ankle Pain Associated symptoms: no fever        Past Medical History:  Diagnosis Date  . Complication of anesthesia    woke up during anesthesia with wisdom teeth extraction  . Genital herpes    no outbreaks in years  . History of trichomonal vaginitis   . Iron deficiency anemia   . Sickle cell trait (HCC)   . Wears glasses     Patient Active Problem List   Diagnosis Date Noted  . Cesarean delivery delivered 03/08/2018  . S/P repeat low transverse C-section 12/09/2013    Past Surgical History:  Procedure Laterality Date  . CESAREAN SECTION N/A 12/08/2013   Procedure: CESAREAN SECTION;  Surgeon: Oliver Pila, MD;  Location: [facility];  Service: Obstetrics;  Laterality: N/A;  . CESAREAN SECTION N/A 03/08/2018   Procedure: CESAREAN SECTION;  Surgeon: Philip Aspen, DO;  Location: [facility] BIRTHING SUITES;  Service: Obstetrics;  Laterality: N/A;  . CESAREAN SECTION  2011  . DILATION AND EVACUATION N/A 03/24/2019   Procedure: DILATATION AND EVACUATION;  Surgeon: Philip Aspen, DO;  Location: [facility];  Service: Gynecology;  Laterality: N/A;  . WISDOM TOOTH EXTRACTION  teen     OB History    Gravida  5   Para  3   Term  3   Preterm      AB    1   Living  3     SAB  1   TAB      Ectopic      Multiple  0   Live Births  3           Family History  Problem Relation Age of Onset  . Hypertension Other     Social History   Tobacco Use  . Smoking status: Never Smoker  . Smokeless tobacco: Never Used  Vaping Use  . Vaping Use: Former  . Devices: per pt only used vapers for 2 wks in 2019  Substance Use Topics  . Alcohol use: Yes    Alcohol/week: 7.0 standard drinks    Types: 7 Standard drinks or equivalent per week    Comment: occasional with pregnancy,  (03-20-2019 1 drink per day  . Drug use: Not Currently    Types: Marijuana    Comment: occasional    Home Medications Prior to Admission medications   Medication Sig Start Date End Date Taking? Authorizing Provider  baclofen (LIORESAL) 10 MG tablet Take 0.5-1 tablets (5-10 mg total) by mouth 3 (three) times daily as needed for muscle spasms. 03/02/20   Hilts, Casimiro Needle, MD  cyclobenzaprine (FLEXERIL) 10 MG tablet Take 1 tablet (10 mg total) by mouth 3 (three) times daily  as needed for muscle spasms. Patient not taking: Reported on 03/08/2020 02/04/20   Merrilee Jansky, MD  gabapentin (NEURONTIN) 100 MG capsule 1 PO q HS, may increase to 1 PO TID if needed Patient not taking: Reported on 03/08/2020 03/02/20   Hilts, Casimiro Needle, MD  ibuprofen (ADVIL) 800 MG tablet Take 1 tablet (800 mg total) by mouth every 8 (eight) hours as needed for moderate pain. Patient not taking: Reported on 03/08/2020 02/27/20   Grayce Sessions, NP  levonorgestrel (MIRENA) 20 MCG/24HR IUD 1 each by Intrauterine route once.    [provider]  naproxen (NAPROSYN) 500 MG tablet Take 1 tablet (500 mg total) by mouth 2 (two) times daily for 7 days. 04/24/20 05/01/20  Claude Manges, PA-C  predniSONE (DELTASONE) 20 MG tablet Take 20 mg by mouth daily. 02/15/20   [provider]  ferrous sulfate 325 (65 FE) MG tablet Take 1 tablet (325 mg total) by mouth daily with breakfast. Patient  not taking: Reported on 08/15/2019 02/26/19 11/26/19  Allie Bossier, MD    Allergies    Patient has no known allergies.  Review of Systems   Review of Systems  Constitutional: Negative for fever.  Musculoskeletal: Positive for arthralgias.    Physical Exam Updated Vital Signs BP 120/70   Pulse 74   Temp 97.7 F (36.5 C) (Oral)   Resp 18   Ht 5\' 7"  (1.702 m)   Wt 119.7 kg   SpO2 99%   Breastfeeding No   BMI 41.35 kg/m   Physical Exam Vitals and nursing note reviewed.  Constitutional:      Appearance: Normal appearance.  HENT:     Head: Normocephalic and atraumatic.     Mouth/Throat:     Mouth: Mucous membranes are moist.  Cardiovascular:     Rate and Rhythm: Normal rate.  Pulmonary:     Effort: Pulmonary effort is normal.  Abdominal:     General: Abdomen is flat.  Musculoskeletal:     Cervical back: Normal range of motion and neck supple.     Left ankle: No swelling, deformity, ecchymosis or lacerations. Tenderness present over the lateral malleolus and medial malleolus. Normal range of motion. Normal pulse.     Left Achilles Tendon: Tenderness present.       Legs:     Comments: Pain with palpation along the left posterior aspect of the ankle.  Radiating pain from lateral and medial malleolus into the back of the ankle.  No limited range of motion, no swelling, neurovascularly intact.  Skin:    General: Skin is warm and dry.  Neurological:     Mental Status: She is alert and oriented to person, place, and time.     ED Results / Procedures / Treatments   Labs (all labs ordered are listed, but only abnormal results are displayed) Labs Reviewed - No data to display  EKG None  Radiology DG Ankle Complete Left  Result Date: 04/24/2020 CLINICAL DATA:  Posterior ankle pain starting 3 weeks ago. EXAM: LEFT ANKLE COMPLETE - 3+ VIEW COMPARISON:  None. FINDINGS: Osseous alignment is normal. Ankle mortise is symmetric. No fracture line or displaced fracture fragment  seen. No acute or suspicious osseous lesion. No degenerative change seen. Visualized portions of the hindfoot and midfoot are unremarkable. Soft tissues about the LEFT ankle are unremarkable. IMPRESSION: Negative. Electronically Signed   By: 06/25/2020 M.D.   On: 04/24/2020 09:52    Procedures Procedures (including critical care time)  Medications  Ordered in ED Medications  naproxen (NAPROSYN) tablet 500 mg (has no administration in time range)    ED Course  I have reviewed the triage vital signs and the nursing notes.  Pertinent labs & imaging results that were available during my care of the patient were reviewed by me and considered in my medical decision making (see chart for details).    MDM Rules/Calculators/A&P   Patient with no pertinent past medical history presents to the ED with a chief complaint of left ankle pain x1 month.  Denies any fevers, trauma, during evaluation ankle joint appears stable, there is no swelling, changes in the skin, has not been running any fevers.  Patient is overall well-appearing.  Pain is said to be along the back of the left ankle, along the Achilles distribution however patient has full range of motion with pain.  Has not taken any medications for improvement in her symptoms.  No prior work-up for this.  We discussed x-ray results of her left ankle which did not show any fracture, will provide her with orthopedic follow-up, along with anti-inflammatories to help with symptoms.  Some suspicion for Gillies tendon involvement, no recent fluoroquinolones on board, no trauma.  Patient also recommended to keep elevating, ice along with follow-up with orthopedics.  Patient understands agrees to management, return precautions discussed at length.    Portions of this note were generated with Scientist, clinical (histocompatibility and immunogenetics). Dictation errors may occur despite best attempts at proofreading.  Final Clinical Impression(s) / ED Diagnoses Final diagnoses:  Acute  left ankle pain    Rx / DC Orders ED Discharge Orders         Ordered    naproxen (NAPROSYN) 500 MG tablet  2 times daily     Discontinue  Reprint     04/24/20 1008           Claude Manges, PA-C 04/24/20 1009    Linwood Dibbles, MD 04/24/20 1708

## 2020-04-24 NOTE — ED Notes (Signed)
Pt d/c home per MD order. Discharge summary reviewed, pt verbalizes understanding. ,Off unit via WC- no s./s of acute distress noted,. Discharge ride home.

## 2020-04-24 NOTE — ED Notes (Signed)
Pt transported to xray 

## 2020-04-24 NOTE — ED Triage Notes (Signed)
Pt reports posterior ankle pain started 3 weeks ago. Pain making walking hard.

## 2020-06-29 ENCOUNTER — Encounter (HOSPITAL_COMMUNITY): Payer: Self-pay | Admitting: *Deleted

## 2020-06-29 ENCOUNTER — Emergency Department (HOSPITAL_COMMUNITY)
Admission: EM | Admit: 2020-06-29 | Discharge: 2020-06-29 | Disposition: A | Discharge status: Home / Self Care | Payer: BC Managed Care – PPO | Attending: Emergency Medicine | Admitting: Emergency Medicine

## 2020-06-29 DIAGNOSIS — R102 Pelvic and perineal pain: Secondary | ICD-10-CM | POA: Insufficient documentation

## 2020-06-29 DIAGNOSIS — Z5321 Procedure and treatment not carried out due to patient leaving prior to being seen by health care provider: Secondary | ICD-10-CM | POA: Diagnosis not present

## 2020-06-29 LAB — I-STAT BETA HCG BLOOD, ED (MC, WL, AP ONLY): I-stat hCG, quantitative: <5 m[IU]/mL (ref <5)

## 2020-06-29 LAB — CBC
HCT: 36.3 % (ref 36.0–46.0)
Hemoglobin: 12.6 g/dL (ref 12.0–15.0)
MCH: 28.6 pg (ref 26.0–34.0)
MCHC: 34.7 g/dL (ref 30.0–36.0)
MCV: 82.5 fL (ref 80.0–100.0)
Platelets: 194 10*3/uL (ref 150–400)
RBC: 4.4 MIL/uL (ref 3.87–5.11)
RDW: 12.3 % (ref 11.5–15.5)
WBC: 4.9 10*3/uL (ref 4.0–10.5)
nRBC: 0 % (ref 0.0–0.2)

## 2020-06-29 LAB — COMPREHENSIVE METABOLIC PANEL
ALT: 17 U/L (ref 0–44)
AST: 19 U/L (ref 15–41)
Albumin: 3.9 g/dL (ref 3.5–5.0)
Alkaline Phosphatase: 69 U/L (ref 38–126)
Anion gap: 9 (ref 5–15)
BUN: 7 mg/dL (ref 6–20)
CO2: 25 mmol/L (ref 22–32)
Calcium: 9.1 mg/dL (ref 8.9–10.3)
Chloride: 103 mmol/L (ref 98–111)
Creatinine, Ser: 0.63 mg/dL (ref 0.44–1.00)
GFR calc Af Amer: >60 mL/min (ref >60)
GFR calc non Af Amer: >60 mL/min (ref >60)
Glucose, Bld: 92 mg/dL (ref 70–99)
Potassium: 4 mmol/L (ref 3.5–5.1)
Sodium: 137 mmol/L (ref 135–145)
Total Bilirubin: 0.6 mg/dL (ref 0.3–1.2)
Total Protein: 7.1 g/dL (ref 6.5–8.1)

## 2020-06-29 NOTE — ED Triage Notes (Signed)
To ED for eval of vaginal pain after intercourse. States she has an IUD and feels a shooting pain 'right through my vagina up into my stomach'. No vaginal bleeding. No hx of painful intercourse. Pt does not have menses with IUD. Pain with urination prior to arrival to ED.

## 2020-08-30 ENCOUNTER — Other Ambulatory Visit: Payer: Self-pay

## 2020-08-30 ENCOUNTER — Ambulatory Visit (HOSPITAL_COMMUNITY)
Admission: EM | Admit: 2020-08-30 | Discharge: 2020-08-30 | Disposition: A | Discharge status: Home / Self Care | Payer: BC Managed Care – PPO | Attending: Urgent Care | Admitting: Urgent Care

## 2020-08-30 ENCOUNTER — Encounter (HOSPITAL_COMMUNITY): Payer: Self-pay

## 2020-08-30 DIAGNOSIS — J069 Acute upper respiratory infection, unspecified: Secondary | ICD-10-CM | POA: Insufficient documentation

## 2020-08-30 DIAGNOSIS — Z20822 Contact with and (suspected) exposure to covid-19: Secondary | ICD-10-CM | POA: Insufficient documentation

## 2020-08-30 DIAGNOSIS — R0789 Other chest pain: Secondary | ICD-10-CM | POA: Diagnosis not present

## 2020-08-30 DIAGNOSIS — J029 Acute pharyngitis, unspecified: Secondary | ICD-10-CM | POA: Diagnosis not present

## 2020-08-30 DIAGNOSIS — R059 Cough, unspecified: Secondary | ICD-10-CM | POA: Diagnosis not present

## 2020-08-30 DIAGNOSIS — H6123 Impacted cerumen, bilateral: Secondary | ICD-10-CM | POA: Diagnosis present

## 2020-08-30 LAB — SARS CORONAVIRUS 2 (TAT 6-24 HRS): SARS Coronavirus 2: NEGATIVE

## 2020-08-30 MED ORDER — CETIRIZINE HCL 10 MG PO TABS: 10.0000 mg | ORAL_TABLET | Freq: Every day | ORAL | 0 refills | Status: AC

## 2020-08-30 MED ORDER — BENZONATATE 100 MG PO CAPS: 100.0000 mg | ORAL_CAPSULE | Freq: Three times a day (TID) | ORAL | 0 refills | Status: AC | PRN

## 2020-08-30 MED ORDER — PSEUDOEPHEDRINE HCL 30 MG PO TABS: 30.0000 mg | ORAL_TABLET | Freq: Three times a day (TID) | ORAL | 0 refills | Status: AC | PRN

## 2020-08-30 MED ORDER — PROMETHAZINE-DM 6.25-15 MG/5ML PO SYRP: 5.0000 mL | ORAL_SOLUTION | Freq: Every evening | ORAL | 0 refills | Status: AC | PRN

## 2020-08-30 NOTE — Discharge Instructions (Addendum)

## 2020-08-30 NOTE — ED Provider Notes (Signed)
[facility]   MRN: [id number] DOB: 03-15-1986  Subjective:   Michele Becker is a 34 y.o. female presenting for 4-day history of sore throat with an episode of vomiting, malaise and fatigue.  Has also had cough and chest tightness.  Feels that her ears are clogged up as well.  Denies chest pain, shortness of breath, body aches.  Thinks that she was breathing in fumes at work, was told that it might have been sulfuric acid.  Has not had her COVID vaccination.  No current facility-administered medications for this encounter.  Current Outpatient Medications:  .  baclofen (LIORESAL) 10 MG tablet, Take 0.5-1 tablets (5-10 mg total) by mouth 3 (three) times daily as needed for muscle spasms., Disp: 30 each, Rfl: 3 .  cyclobenzaprine (FLEXERIL) 10 MG tablet, Take 1 tablet (10 mg total) by mouth 3 (three) times daily as needed for muscle spasms. (Patient not taking: Reported on 03/08/2020), Disp: 30 tablet, Rfl: 0 .  gabapentin (NEURONTIN) 100 MG capsule, 1 PO q HS, may increase to 1 PO TID if needed (Patient not taking: Reported on 03/08/2020), Disp: 90 capsule, Rfl: 3 .  ibuprofen (ADVIL) 800 MG tablet, Take 1 tablet (800 mg total) by mouth every 8 (eight) hours as needed for moderate pain. (Patient not taking: Reported on 03/08/2020), Disp: 90 tablet, Rfl: 1 .  levonorgestrel (MIRENA) 20 MCG/24HR IUD, 1 each by Intrauterine route once., Disp: , Rfl:  .  predniSONE (DELTASONE) 20 MG tablet, Take 20 mg by mouth daily., Disp: , Rfl:    No Known Allergies  Past Medical History:  Diagnosis Date  . Complication of anesthesia    woke up during anesthesia with wisdom teeth extraction  . Genital herpes    no outbreaks in years  . History of trichomonal vaginitis   . Iron deficiency anemia   . Sickle cell trait (HCC)   . Wears glasses      Past Surgical History:  Procedure Laterality Date  . CESAREAN SECTION N/A 12/08/2013   Procedure: CESAREAN SECTION;  Surgeon: Oliver Pila, MD;  Location: [facility];  Service: Obstetrics;  Laterality: N/A;  . CESAREAN SECTION N/A 03/08/2018   Procedure: CESAREAN SECTION;  Surgeon: Philip Aspen, DO;  Location: [facility] BIRTHING SUITES;  Service: Obstetrics;  Laterality: N/A;  . CESAREAN SECTION  2011  . DILATION AND EVACUATION N/A 03/24/2019   Procedure: DILATATION AND EVACUATION;  Surgeon: Philip Aspen, DO;  Location: [facility] [place];  Service: Gynecology;  Laterality: N/A;  . WISDOM TOOTH EXTRACTION  teen    Family History  Problem Relation Age of Onset  . Hypertension Other     Social History   Tobacco Use  . Smoking status: Never Smoker  . Smokeless tobacco: Never Used  Vaping Use  . Vaping Use: Former  . Devices: per pt only used vapers for 2 wks in 2019  Substance Use Topics  . Alcohol use: Yes    Alcohol/week: 7.0 standard drinks    Types: 7 Standard drinks or equivalent per week    Comment: occasional with pregnancy,  (03-20-2019 1 drink per day  . Drug use: Not Currently    Types: Marijuana    Comment: occasional    ROS   Objective:   Vitals: BP (!) 142/96 (BP Location: Right Arm)   Pulse 78   Temp 98.8 F (37.1 C) (Oral)   Resp 17   SpO2 100%   Physical Exam Constitutional:  General: She is not in acute distress.    Appearance: Normal appearance. She is well-developed. She is not ill-appearing, toxic-appearing or diaphoretic.  HENT:     Head: Normocephalic and atraumatic.     Right Ear: Tympanic membrane and ear canal normal. No drainage or tenderness. No middle ear effusion. Tympanic membrane is not erythematous.     Left Ear: Tympanic membrane and ear canal normal. No drainage or tenderness.  No middle ear effusion. Tympanic membrane is not erythematous.     Nose: Nose normal. No congestion or rhinorrhea.     Mouth/Throat:     Mouth: Mucous membranes are moist. No oral lesions.     Pharynx: No pharyngeal swelling, oropharyngeal exudate, posterior oropharyngeal  erythema or uvula swelling.     Tonsils: No tonsillar exudate or tonsillar abscesses.  Eyes:     Extraocular Movements: Extraocular movements intact.     Right eye: Normal extraocular motion.     Left eye: Normal extraocular motion.     Conjunctiva/sclera: Conjunctivae normal.     Pupils: Pupils are equal, round, and reactive to light.  Cardiovascular:     Rate and Rhythm: Normal rate and regular rhythm.     Pulses: Normal pulses.     Heart sounds: Normal heart sounds. No murmur heard.  No friction rub. No gallop.   Pulmonary:     Effort: Pulmonary effort is normal. No respiratory distress.     Breath sounds: Normal breath sounds. No stridor. No wheezing, rhonchi or rales.  Musculoskeletal:     Cervical back: Normal range of motion and neck supple.  Lymphadenopathy:     Cervical: No cervical adenopathy.  Skin:    General: Skin is warm and dry.     Findings: No rash.  Neurological:     General: No focal deficit present.     Mental Status: She is alert and oriented to person, place, and time.  Psychiatric:        Mood and Affect: Mood normal.        Behavior: Behavior normal.        Thought Content: Thought content normal.     Assessment and Plan :   PDMP not reviewed this encounter.  1. Viral URI with cough   2. Chest tightness     Will manage for viral illness such as viral URI, viral syndrome, viral rhinitis, COVID-19. Counseled patient on nature of COVID-19 including modes of transmission, diagnostic testing, management and supportive care.  Offered scripts for symptomatic relief. COVID 19 testing is pending. Counseled patient on potential for adverse effects with medications prescribed/recommended today, ER and return-to-clinic precautions discussed, patient verbalized understanding.     Wallis Bamberg, PA-C 08/30/20 1034

## 2020-08-30 NOTE — ED Triage Notes (Addendum)
Pt in with c/o ST and vomiting that started Thursday night while at work.   States that her chest also feels tight and her ears feel clogged. Denies any radiation to arm/jaw, back, no change in loc, or sob  States that she think she may have inhaled fumes at work because she was smelling something very potent that her job said might be sulfuric acid  Pt has cough drops with no relief  Denies diarrhea, sob, runny nose

## 2021-08-17 ENCOUNTER — Telehealth: Payer: Self-pay | Admitting: Family Medicine

## 2021-08-17 NOTE — Telephone Encounter (Signed)
Received call from pt requesting copy of records. I advised need to sign release form. Emailed to patient per her request [email or web address]. ph [phone]

## 2022-07-07 IMAGING — DX DG ANKLE COMPLETE 3+V*L*
3 series · 3 of 3 positions shown · non-contrast
Comparison: None.

CLINICAL DATA: Posterior ankle pain starting 3 weeks ago.

EXAM:
LEFT ANKLE COMPLETE - 3+ VIEW

[ankle ap]
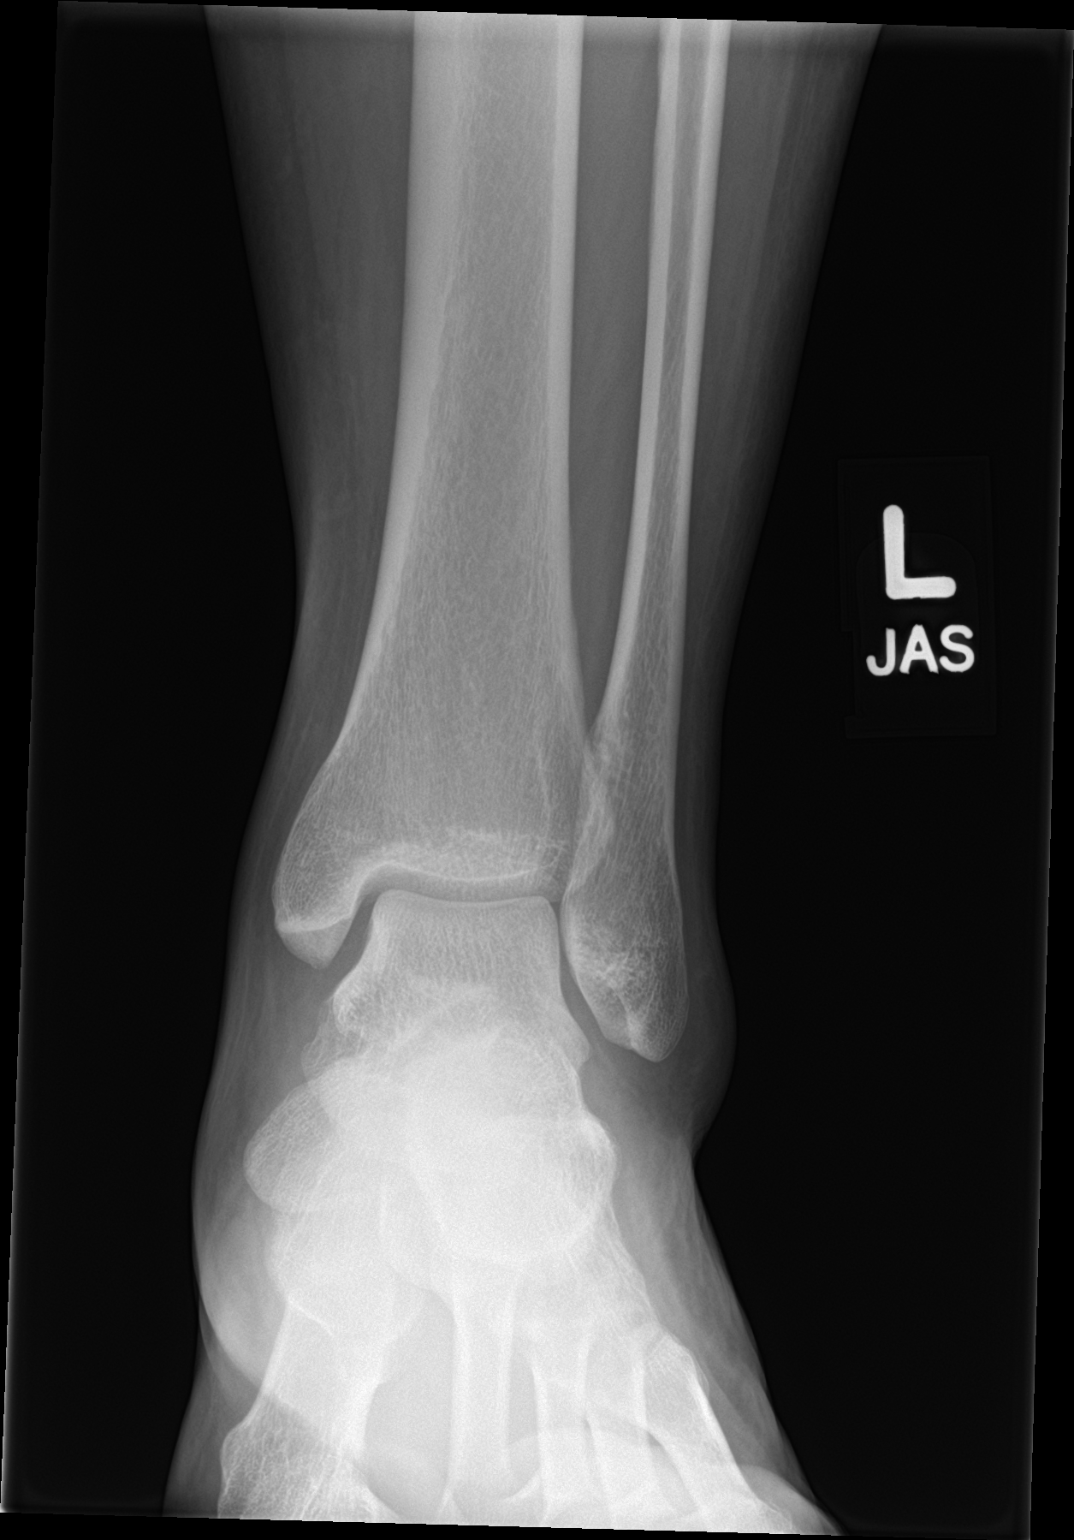

[ankle obl]
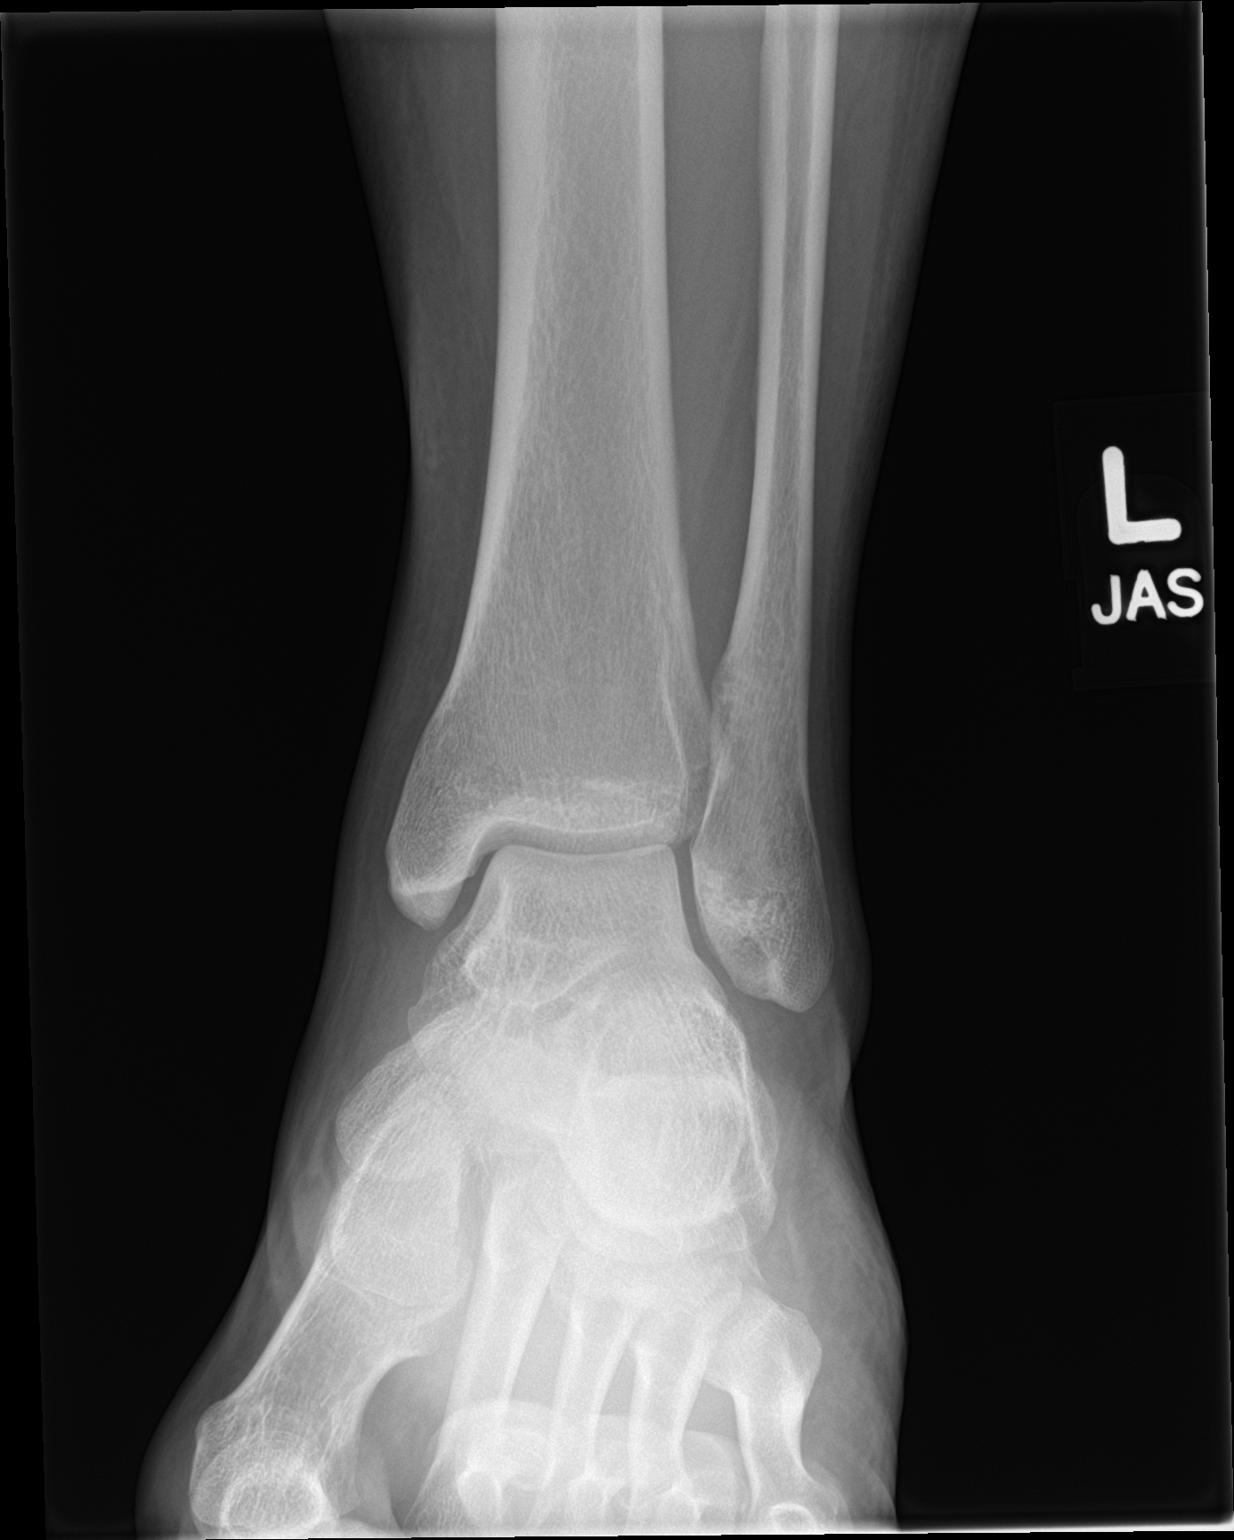

[ankle lat]
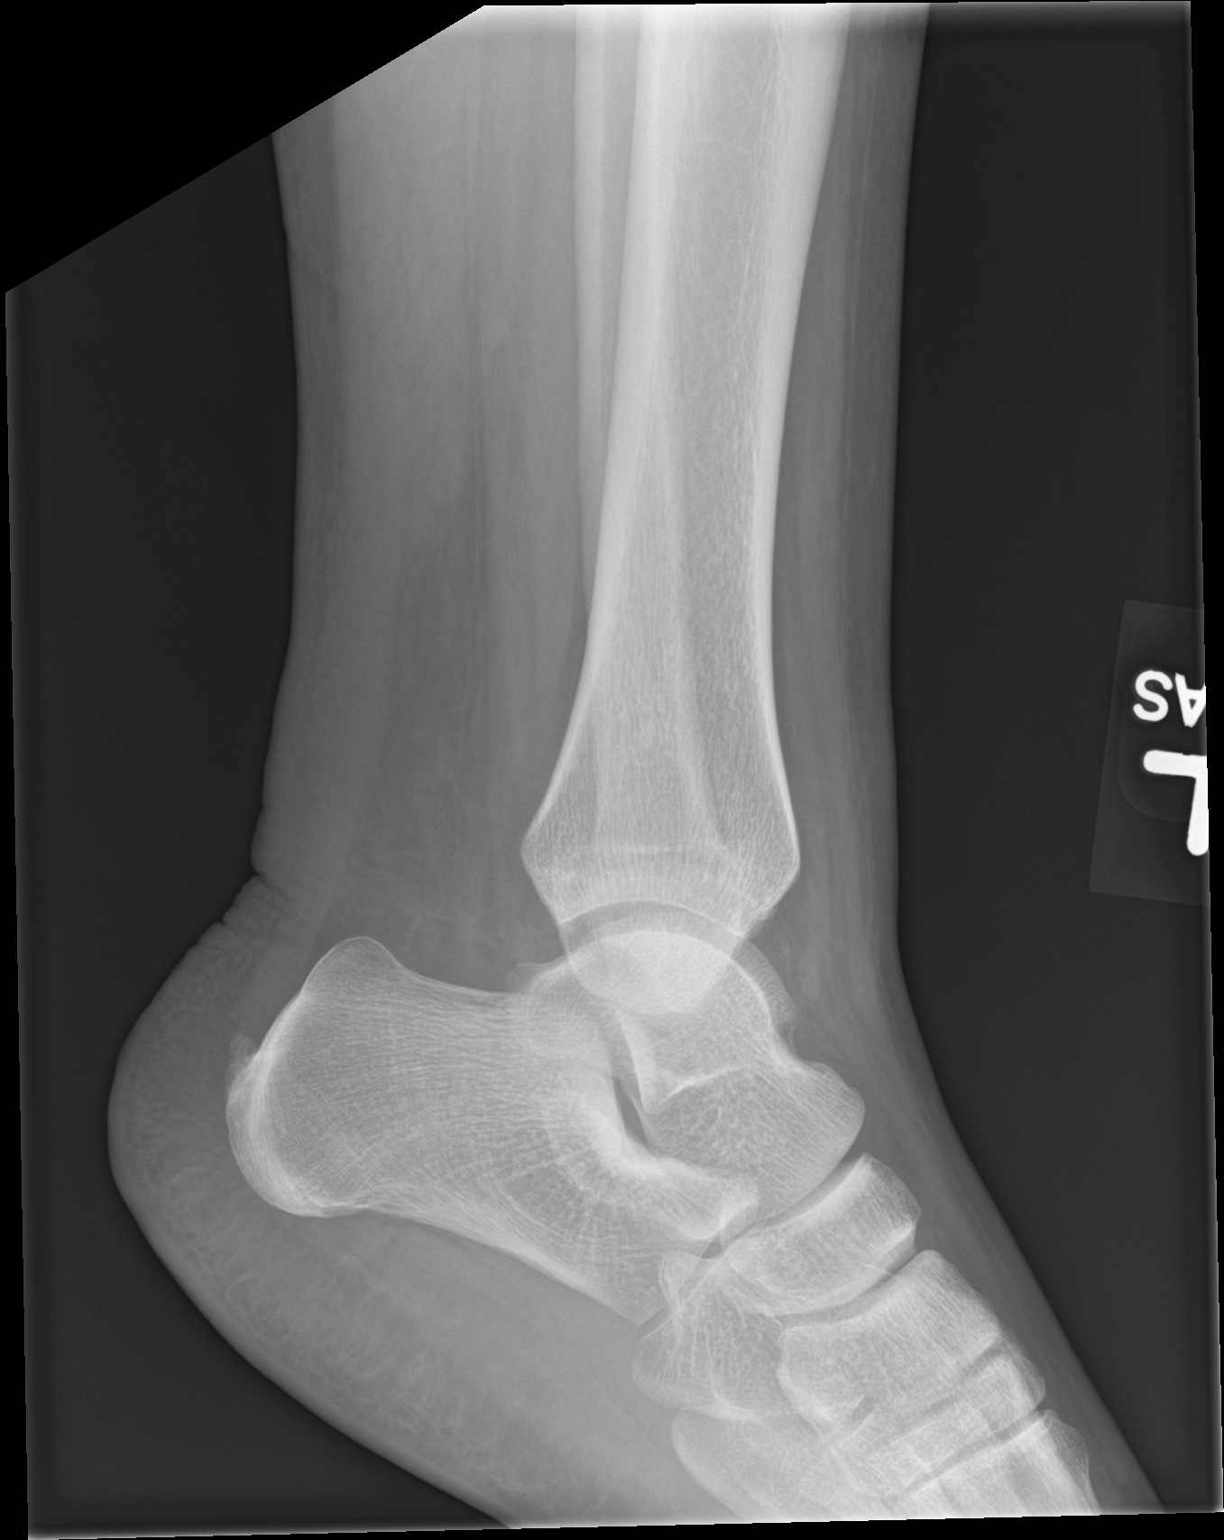

[3 of 3 positions shown; findings below may reference images not displayed]

FINDINGS: Osseous alignment is normal. Ankle mortise is symmetric. No fracture
line or displaced fracture fragment seen. No acute or suspicious
osseous lesion. No degenerative change seen. Visualized portions of
the hindfoot and midfoot are unremarkable. Soft tissues about the
LEFT ankle are unremarkable.
IMPRESSION: Negative.
